# Patient Record
Sex: Male | Born: 1995 | Race: White | Hispanic: No | Marital: Single | Smoking: Former smoker
Health system: Southern US, Community
[De-identification: ages and names within clinical notes are randomized; demographics above are authoritative.]

## PROBLEM LIST (undated history)

## (undated) HISTORY — PX: CIRCUMCISION: SUR203

---
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
## 2015-02-07 ENCOUNTER — Ambulatory Visit (INDEPENDENT_AMBULATORY_CARE_PROVIDER_SITE_OTHER): Payer: BLUE CROSS/BLUE SHIELD

## 2015-02-07 ENCOUNTER — Ambulatory Visit (INDEPENDENT_AMBULATORY_CARE_PROVIDER_SITE_OTHER): Payer: BLUE CROSS/BLUE SHIELD | Admitting: Physician Assistant

## 2015-02-07 VITALS — BP 112/70 | HR 109 | Temp 97.8°F | Resp 20 | Ht 67.5 in | Wt 138.4 lb

## 2015-02-07 DIAGNOSIS — M545 Low back pain, unspecified: Secondary | ICD-10-CM

## 2015-02-07 DIAGNOSIS — M62830 Muscle spasm of back: Secondary | ICD-10-CM | POA: Diagnosis not present

## 2015-02-07 DIAGNOSIS — M546 Pain in thoracic spine: Secondary | ICD-10-CM | POA: Diagnosis not present

## 2015-02-07 MED ORDER — CYCLOBENZAPRINE HCL 5 MG PO TABS: 5.0000 mg | ORAL_TABLET | Freq: Three times a day (TID) | ORAL | Status: DC | PRN

## 2015-02-07 NOTE — Progress Notes (Signed)
   Subjective:    Patient ID: Steven Dyer, male    DOB: 09/28/1996, 19 y.o.   MRN: [id number]  Chief Complaint  Patient presents with  . Back Pain    lower back pain, fell down steps last night    Prior to Admission medications   Not on File   Medications, allergies, past medical history, surgical history, family history, social history and problem list reviewed and updated.  HPI  [age] yom with no significant pmh presents with back pain after fall.   Was carrying towels up steps yest evening and tripped over rug at top. Fell backwards down 18 steps. Does not think he hit his head. Low back pain since the fall. Kept him up last night. Denies HA, vision changes, confusion, loc, head or neck pain. Denies bowel/bladder dysfx. Denies perianal los. Denies numbness/weakness/tingling down any extremity. Denies hematuria.   Mildly elevated HR today. Pt states his low back is very painful.   Review of Systems See HPI.     Objective:   Physical Exam  Constitutional: He is oriented to person, place, and time. He appears well-developed and well-nourished.  Non-toxic appearance. He does not have a sickly appearance. He does not appear ill. No distress.  BP 112/70 mmHg  Pulse 109  Temp(Src) 97.8 F (36.6 C) (Oral)  Resp 20  Ht 5' 7.5" (1.715 m)  Wt 138 lb 6.4 oz (62.778 kg)  BMI 21.34 kg/m2  SpO2 98%   Eyes: Conjunctivae and EOM are normal. Pupils are equal, round, and reactive to light.  Abdominal: There is no CVA tenderness.  Musculoskeletal:       Cervical back: He exhibits normal range of motion, no tenderness and no bony tenderness.       Thoracic back: He exhibits bony tenderness. He exhibits normal range of motion.       Lumbar back: He exhibits decreased range of motion, tenderness, bony tenderness and spasm.  TTP over thoracic and lumbar spine. TTP lumbar paraspinals. Spasms bilaterally over lumbar region. Negative SLR bilaterally. Decreased low back flexion due to pain.     Neurological: He is alert and oriented to person, place, and time. He has normal strength. No cranial nerve deficit or sensory deficit. He displays a negative Romberg sign.  Reflex Scores:      Patellar reflexes are 2+ on the right side and 2+ on the left side.  [facility] reading (PRIMARY) by  Dr. Alwyn RenHopper. Thoracic Findings: Normal Lumbar Findings: Normal     Assessment & Plan:   [age] yom with no significant pmh presents with back pain after fall.   Midline thoracic back pain - Plan: DG Thoracic Spine 2 View Midline low back pain without sciatica - Plan: DG Lumbar Spine 2-3 Views --xrays normal --spasm lumbar paraspinals --> heat, massage, flexeril prn, light rom, cycle tylenol/nsaids --rtc if no improvement 4-5 days or with numbness/weakness/tingling  Donnajean Lopesodd M. Kitiara Hintze, PA-C Physician Assistant-Certified [facility]  02/07/2015 2:13 PM

## 2015-02-07 NOTE — Patient Instructions (Signed)
Your spinal xrays looked normal.  You have spasm over the muscles along your lower spine. Heat, light massage, light range of motion, and flexeril every 8 hours as needed will help with the pain/spasm. Please come back to be seen if you're not feeling better in 4-5 days or if you start noticing any numbness or weakness.   Muscle Cramps and Spasms Muscle cramps and spasms occur when a muscle or muscles tighten and you have no control over this tightening (involuntary muscle contraction). They are a common problem and can develop in any muscle. The most common place is in the calf muscles of the leg. Both muscle cramps and muscle spasms are involuntary muscle contractions, but they also have differences:   Muscle cramps are sporadic and painful. They may last a few seconds to a quarter of an hour. Muscle cramps are often more forceful and last longer than muscle spasms.  Muscle spasms may or may not be painful. They may also last just a few seconds or much longer. CAUSES  It is uncommon for cramps or spasms to be due to a serious underlying problem. In many cases, the cause of cramps or spasms is unknown. Some common causes are:   Overexertion.   Overuse from repetitive motions (doing the same thing over and over).   Remaining in a certain position for a long period of time.   Improper preparation, form, or technique while performing a sport or activity.   Dehydration.   Injury.   Side effects of some medicines.   Abnormally low levels of the salts and ions in your blood (electrolytes), especially potassium and calcium. This could happen if you are taking water pills (diuretics) or you are pregnant.  Some underlying medical problems can make it more likely to develop cramps or spasms. These include, but are not limited to:   Diabetes.   Parkinson disease.   Hormone disorders, such as thyroid problems.   Alcohol abuse.   Diseases specific to muscles, joints, and bones.    Blood vessel disease where not enough blood is getting to the muscles.  HOME CARE INSTRUCTIONS   Stay well hydrated. Drink enough water and fluids to keep your urine clear or pale yellow.  It may be helpful to massage, stretch, and relax the affected muscle.  For tight or tense muscles, use a warm towel, heating pad, or hot shower water directed to the affected area.  If you are sore or have pain after a cramp or spasm, applying ice to the affected area may relieve discomfort.  Put ice in a plastic bag.  Place a towel between your skin and the bag.  Leave the ice on for 15-20 minutes, 03-04 times a day.  Medicines used to treat a known cause of cramps or spasms may help reduce their frequency or severity. Only take over-the-counter or prescription medicines as directed by your caregiver. SEEK MEDICAL CARE IF:  Your cramps or spasms get more severe, more frequent, or do not improve over time.  MAKE SURE YOU:   Understand these instructions.  Will watch your condition.  Will get help right away if you are not doing well or get worse. Document Released: 03/23/2002 Document Revised: 01/26/2013 Document Reviewed: 09/17/2012 [facility] Patient Information 2015 [employer] [place]. This information is not intended to replace advice given to you by your health care provider. Make sure you discuss any questions you have with your health care provider.

## 2015-02-15 ENCOUNTER — Encounter (HOSPITAL_COMMUNITY): Payer: Self-pay

## 2015-02-15 ENCOUNTER — Emergency Department (HOSPITAL_COMMUNITY)
Admission: EM | Admit: 2015-02-15 | Discharge: 2015-02-15 | Disposition: A | Discharge status: Home / Self Care | Payer: BLUE CROSS/BLUE SHIELD | Attending: Emergency Medicine | Admitting: Emergency Medicine

## 2015-02-15 ENCOUNTER — Emergency Department (HOSPITAL_COMMUNITY): Payer: BLUE CROSS/BLUE SHIELD

## 2015-02-15 DIAGNOSIS — Y998 Other external cause status: Secondary | ICD-10-CM | POA: Diagnosis not present

## 2015-02-15 DIAGNOSIS — S300XXA Contusion of lower back and pelvis, initial encounter: Secondary | ICD-10-CM

## 2015-02-15 DIAGNOSIS — R55 Syncope and collapse: Secondary | ICD-10-CM | POA: Diagnosis present

## 2015-02-15 DIAGNOSIS — S20229A Contusion of unspecified back wall of thorax, initial encounter: Secondary | ICD-10-CM | POA: Diagnosis not present

## 2015-02-15 DIAGNOSIS — Y9389 Activity, other specified: Secondary | ICD-10-CM | POA: Insufficient documentation

## 2015-02-15 DIAGNOSIS — R569 Unspecified convulsions: Secondary | ICD-10-CM | POA: Insufficient documentation

## 2015-02-15 DIAGNOSIS — Y92481 Parking lot as the place of occurrence of the external cause: Secondary | ICD-10-CM | POA: Insufficient documentation

## 2015-02-15 LAB — CBG MONITORING, ED: GLUCOSE-CAPILLARY: 119 mg/dL — AB (ref 70–99)

## 2015-02-15 MED ORDER — HYDROCODONE-ACETAMINOPHEN 5-325 MG PO TABS: 1.0000 | ORAL_TABLET | ORAL | Status: DC | PRN

## 2015-02-15 NOTE — ED Notes (Addendum)
Pt refusing lab work, IV, or finger stick. Pt reports he is scared of needles and says "I'm good." Dr. Blinda LeatherwoodPollina is aware of this and agreeable that pt can refuse at this time.

## 2015-02-15 NOTE — ED Notes (Signed)
Bed: WA17 Expected date:  Expected time:  Means of arrival:  Comments: seizure 

## 2015-02-15 NOTE — Discharge Instructions (Signed)
Syncope °Syncope is a medical term for fainting or passing out. This means you lose consciousness and drop to the ground. People are generally unconscious for less than 5 minutes. You may have some muscle twitches for up to 15 seconds before waking up and returning to normal. Syncope occurs more often in older adults, but it can happen to anyone. While most causes of syncope are not dangerous, syncope can be a sign of a serious medical problem. It is important to seek medical care.  °CAUSES  °Syncope is caused by a sudden drop in blood flow to the brain. The specific cause is often not determined. Factors that can bring on syncope include: °· Taking medicines that lower blood pressure. °· Sudden changes in posture, such as standing up quickly. °· Taking more medicine than prescribed. °· Standing in one place for too long. °· Seizure disorders. °· Dehydration and excessive exposure to heat. °· Low blood sugar (hypoglycemia). °· Straining to have a bowel movement. °· Heart disease, irregular heartbeat, or other circulatory problems. °· Fear, emotional distress, seeing blood, or severe pain. °SYMPTOMS  °Right before fainting, you may: °· Feel dizzy or light-headed. °· Feel nauseous. °· See all white or all black in your field of vision. °· Have cold, clammy skin. °DIAGNOSIS  °Your health care provider will ask about your symptoms, perform a physical exam, and perform an electrocardiogram (ECG) to record the electrical activity of your heart. Your health care provider may also perform other heart or blood tests to determine the cause of your syncope which may include: °· Transthoracic echocardiogram (TTE). During echocardiography, sound waves are used to evaluate how blood flows through your heart. °· Transesophageal echocardiogram (TEE). °· Cardiac monitoring. This allows your health care provider to monitor your heart rate and rhythm in real time. °· Holter monitor. This is a portable device that records your  heartbeat and can help diagnose heart arrhythmias. It allows your health care provider to track your heart activity for several days, if needed. °· Stress tests by exercise or by giving medicine that makes the heart beat faster. °TREATMENT  °In most cases, no treatment is needed. Depending on the cause of your syncope, your health care provider may recommend changing or stopping some of your medicines. °HOME CARE INSTRUCTIONS °· Have someone stay with you until you feel stable. °· Do not drive, use machinery, or play sports until your health care provider says it is okay. °· Keep all follow-up appointments as directed by your health care provider. °· Lie down right away if you start feeling like you might faint. Breathe deeply and steadily. Wait until all the symptoms have passed. °· Drink enough fluids to keep your urine clear or pale yellow. °· If you are taking blood pressure or heart medicine, get up slowly and take several minutes to sit and then stand. This can reduce dizziness. °SEEK IMMEDIATE MEDICAL CARE IF:  °· You have a severe headache. °· You have unusual pain in the chest, abdomen, or back. °· You are bleeding from your mouth or rectum, or you have black or tarry stool. °· You have an irregular or very fast heartbeat. °· You have pain with breathing. °· You have repeated fainting or seizure-like jerking during an episode. °· You faint when sitting or lying down. °· You have confusion. °· You have trouble walking. °· You have severe weakness. °· You have vision problems. °If you fainted, call your local emergency services (911 in [place] Do not drive   yourself to the hospital.  MAKE SURE YOU:  Understand these instructions.  Will watch your condition.  Will get help right away if you are not doing well or get worse. Document Released: 10/01/2005 Document Revised: 10/06/2013 Document Reviewed: 11/30/2011 [facility] Patient Information 2015 [place], [place]. This information is not intended to replace  advice given to you by your health care provider. Make sure you discuss any questions you have with your health care provider. Driving and Equipment Restrictions Some medical problems make it dangerous to drive, ride a bike, or use machines. Some of these problems are:  A hard blow to the head (concussion).  Passing out (fainting).  Twitching and shaking (seizures).  Low blood sugar.  Taking medicine to help you relax (sedatives).  Taking pain medicines.  Wearing an eye patch.  Wearing splints. This can make it hard to use parts of your body that you need to drive safely. HOME CARE   Do not drive until your doctor says it is okay.  Do not use machines until your doctor says it is okay. You may need a form signed by your doctor (medical release) before you can drive again. You may also need this form before you do other tasks where you need to be fully alert. MAKE SURE YOU:  Understand these instructions.  Will watch your condition.  Will get help right away if you are not doing well or get worse. Document Released: 11/08/2004 Document Revised: 12/24/2011 Document Reviewed: 02/08/2010 [facility] Patient Information 2015 [place], [place]. This information is not intended to replace advice given to you by your health care provider. Make sure you discuss any questions you have with your health care provider.

## 2015-02-15 NOTE — ED Notes (Signed)
Patient refused to get his blood collect.  I made nurse aware.
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
## 2015-02-15 NOTE — ED Notes (Signed)
Patient transported to X-ray 
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
## 2015-03-03 ENCOUNTER — Other Ambulatory Visit: Payer: Self-pay | Admitting: Family

## 2015-03-03 ENCOUNTER — Encounter: Payer: Self-pay | Admitting: *Deleted

## 2015-03-03 DIAGNOSIS — R404 Transient alteration of awareness: Secondary | ICD-10-CM

## 2015-03-09 ENCOUNTER — Ambulatory Visit (INDEPENDENT_AMBULATORY_CARE_PROVIDER_SITE_OTHER): Payer: BLUE CROSS/BLUE SHIELD | Admitting: Family Medicine

## 2015-03-09 ENCOUNTER — Ambulatory Visit (INDEPENDENT_AMBULATORY_CARE_PROVIDER_SITE_OTHER): Payer: BLUE CROSS/BLUE SHIELD

## 2015-03-09 VITALS — BP 104/78 | HR 97 | Temp 98.1°F | Resp 16 | Ht 69.0 in | Wt 139.0 lb

## 2015-03-09 DIAGNOSIS — S60221A Contusion of right hand, initial encounter: Secondary | ICD-10-CM | POA: Diagnosis not present

## 2015-03-09 DIAGNOSIS — M79641 Pain in right hand: Secondary | ICD-10-CM

## 2015-03-09 MED ORDER — HYDROCODONE-ACETAMINOPHEN 5-325 MG PO TABS: 1.0000 | ORAL_TABLET | ORAL | Status: DC | PRN

## 2015-03-09 NOTE — Progress Notes (Signed)
[facility] [address], [place] [id number] [phone]  Date:  03/09/2015   Name:  Steven Dyer   DOB:  04/25/1996   MRN:  [id number]  PCP:  Carmin RichmondLARK,WILLIAM D, MD    Chief Complaint: Hand Pain   History of Present Illness:  Steven MylarJacob M Bovenzi is a 19 y.o. very pleasant male patient who presents with the following:  He caught his right hand in a wooden door yesterday.  It hit him on the 3rd knuckle.  He has a lot of pain in the area. He was up last night due to the pain, and it seems to be getting worse.  He has used "a lot of ibuprofen" but it has not helped.  His father is at clinic and would like him to have something for pain.   He is right handed- he went to school today but had a hard time doing the work due to his hand problem  He is generally in good health  NKDA There are no active problems to display for this patient.   History reviewed. No pertinent past medical history.  History reviewed. No pertinent past surgical history.  History  Substance Use Topics  . Smoking status: Never Smoker   . Smokeless tobacco: Never Used  . Alcohol Use: No    Family History  Problem Relation Age of Onset  . Migraines Mother   . Nephrolithiasis Father     No Known Allergies  Medication list has been reviewed and updated.  Current Outpatient Prescriptions on File Prior to Visit  Medication Sig Dispense Refill  . cyclobenzaprine (FLEXERIL) 5 MG tablet Take 1 tablet (5 mg total) by mouth 3 (three) times daily as needed for muscle spasms. (Patient not taking: Reported on 03/09/2015) 30 tablet 1  . HYDROcodone-acetaminophen (NORCO/VICODIN) 5-325 MG per tablet Take 1-2 tablets by mouth every 4 (four) hours as needed for moderate pain. (Patient not taking: Reported on 03/09/2015) 15 tablet 0  . ibuprofen (ADVIL,MOTRIN) 200 MG tablet Take 400-600 mg by mouth every 6 (six) hours as needed for headache or moderate pain.     No current facility-administered  medications on file prior to visit.    Review of Systems:  As per HPI- otherwise negative. Otherwise he feels well today  Physical Examination: Filed Vitals:   03/09/15 1416  BP: 104/78  Pulse: 97  Temp: 98.1 F (36.7 C)  Resp: 16   Filed Vitals:   03/09/15 1416  Height: 5\' 9"  (1.753 m)  Weight: 139 lb (63.05 kg)   Body mass index is 20.52 kg/(m^2). Ideal Body Weight: Weight in (lb) to have BMI = 25: 168.9  GEN: WDWN, NAD, Non-toxic, A & O x 3, looks well HEENT: Atraumatic, Normocephalic. Neck supple. No masses, No LAD. Ears and Nose: No external deformity. CV: RRR, No M/G/R. No JVD. No thrill. No extra heart sounds. PULM: CTA B, no wheezes, crackles, rhonchi. No retractions. No resp. distress. No accessory muscle use. EXTR: No c/c/e NEURO Normal gait.  PSYCH: Normally interactive. Conversant. Not depressed or anxious appearing.  Calm demeanor.  Right hand: he is tender and has some swelling over the long finger MCP joint.  There is some tenderness at the other 3 MCP joints (not the thumb) but less so.  Fingers are NV intact.  He has pain with full extension of the MCP or full flexion.  Normal strength of the fingers   [facility] reading (PRIMARY) by  Dr. Patsy Lageropland. Right  hand: negative  RIGHT HAND - COMPLETE 3+ VIEW  COMPARISON: Feb 17, 2011  FINDINGS: Frontal, oblique, and lateral views were obtained. There is no fracture or dislocation. Joint spaces appear intact. No erosive change.  IMPRESSION: No fracture or dislocation. No appreciable arthropathy.  Placed in an ulnar gutter splint for protection  tdap 2014  Assessment and Plan: Contusion of right hand, initial encounter  Pain of right hand - Plan: DG Hand Complete Right, HYDROcodone-acetaminophen (NORCO/VICODIN) 5-325 MG per tablet  Ulnar gutter splint for protection.  At this time no apparent fracture.  However if his sx persist he will follow-up vicodin as needed.  Cautioned that dosage on rx is maximum  dose- would recommend that he start with 1 tab every 6-8 hours as needed.  He and his father understand the plan and how to use the medication  Signed Abbe Amsterdam, MD

## 2015-03-09 NOTE — Progress Notes (Signed)
PROCEDURE NOTE: Splint, short arm Patient's father provided consent. Applied 5"x10" ulnar gutter splint to right forearm. Hand was flexed 90 degrees at MCP joint. Patient tolerated this procedure well.  Wallis BambergMario Garvey Westcott, PA-C [facility] [phone] 03/09/2015  3:48 PM

## 2015-03-09 NOTE — Patient Instructions (Signed)
Wear the splint for the next few days.   Take the pain medication (hydrocodone) as needed but do not drive on this medication You can also use ibuprofen or aleve as needed Ice may also be helpful If your hand is not feeling a lot better in about one week please let me know- Sooner if worse.  Assuming you are improving you may remove the splint after 3-4 days (sooner if you no longer need it for pain)

## 2015-03-10 ENCOUNTER — Encounter: Payer: Self-pay | Admitting: Cardiology

## 2015-03-10 ENCOUNTER — Ambulatory Visit (INDEPENDENT_AMBULATORY_CARE_PROVIDER_SITE_OTHER): Payer: BLUE CROSS/BLUE SHIELD | Admitting: Cardiology

## 2015-03-10 VITALS — BP 111/76 | HR 75 | Ht 67.5 in | Wt 140.0 lb

## 2015-03-10 DIAGNOSIS — R55 Syncope and collapse: Secondary | ICD-10-CM | POA: Insufficient documentation

## 2015-03-10 DIAGNOSIS — R000 Tachycardia, unspecified: Secondary | ICD-10-CM

## 2015-03-10 NOTE — Patient Instructions (Signed)
Your physician recommends that you schedule a follow-up appointment to be determined after test  Your physician has requested that you have an echocardiogram. Echocardiography is a painless test that uses sound waves to create images of your heart. It provides your doctor with information about the size and shape of your heart and how well your heart's chambers and valves are working. This procedure takes approximately one hour. There are no restrictions for this procedure.  Your physician recommends that you continue on your current medications as directed. Please refer to the Current Medication list given to you today.  Thank you for choosing [facility]!!
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
## 2015-03-11 ENCOUNTER — Ambulatory Visit: Payer: BLUE CROSS/BLUE SHIELD | Admitting: Cardiology

## 2015-03-15 ENCOUNTER — Other Ambulatory Visit: Payer: Self-pay

## 2015-03-15 ENCOUNTER — Ambulatory Visit (HOSPITAL_COMMUNITY)
Admission: RE | Admit: 2015-03-15 | Discharge: 2015-03-15 | Disposition: A | Discharge status: Home / Self Care | Payer: BLUE CROSS/BLUE SHIELD | Source: Ambulatory Visit | Attending: Family | Admitting: Family

## 2015-03-15 ENCOUNTER — Ambulatory Visit (HOSPITAL_BASED_OUTPATIENT_CLINIC_OR_DEPARTMENT_OTHER): Payer: BLUE CROSS/BLUE SHIELD

## 2015-03-15 DIAGNOSIS — R55 Syncope and collapse: Secondary | ICD-10-CM | POA: Insufficient documentation

## 2015-03-15 DIAGNOSIS — R404 Transient alteration of awareness: Secondary | ICD-10-CM

## 2015-03-15 NOTE — Progress Notes (Signed)
EEG Completed; Results Pending  
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
## 2015-03-16 ENCOUNTER — Ambulatory Visit (INDEPENDENT_AMBULATORY_CARE_PROVIDER_SITE_OTHER): Payer: BLUE CROSS/BLUE SHIELD | Admitting: Pediatrics

## 2015-03-16 ENCOUNTER — Encounter: Payer: Self-pay | Admitting: Pediatrics

## 2015-03-16 ENCOUNTER — Telehealth: Payer: Self-pay | Admitting: *Deleted

## 2015-03-16 VITALS — BP 108/72 | HR 106 | Ht 68.0 in | Wt 142.2 lb

## 2015-03-16 DIAGNOSIS — R55 Syncope and collapse: Secondary | ICD-10-CM | POA: Insufficient documentation

## 2015-03-16 NOTE — Telephone Encounter (Signed)
Pt father made aware, orders placed for 14 day event monitor. Pt father not happy about additional testing required but agreed to proceed. Will place recall for pt to be followed in [address] office.
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
## 2015-03-24 ENCOUNTER — Telehealth: Payer: Self-pay | Admitting: *Deleted

## 2015-03-24 NOTE — Telephone Encounter (Signed)
Routed monitor report to [facility] st office.

## 2015-03-24 NOTE — Telephone Encounter (Signed)
Decision is up to them, they are free to refuse study. There follow up and any paperwork will need to be through the [facility] clinic  Dominga Ferry MD

## 2015-03-24 NOTE — Telephone Encounter (Signed)
Monitoring service spoke w/pt dad who says pt will not be wearing device because pt has had other testing and he thinks it's going over and beyond. Dad says device is in preparation to be sent back. Dad has decided against the study. Will forward to Dr. Wyline Mood.

## 2015-08-24 ENCOUNTER — Ambulatory Visit (INDEPENDENT_AMBULATORY_CARE_PROVIDER_SITE_OTHER): Payer: BLUE CROSS/BLUE SHIELD | Admitting: Family Medicine

## 2015-08-24 VITALS — BP 124/76 | HR 83 | Temp 98.1°F | Resp 16 | Ht 68.5 in | Wt 150.6 lb

## 2015-08-24 DIAGNOSIS — J209 Acute bronchitis, unspecified: Secondary | ICD-10-CM | POA: Diagnosis not present

## 2015-08-24 MED ORDER — AZITHROMYCIN 250 MG PO TABS: ORAL_TABLET | ORAL | Status: DC

## 2015-08-24 MED ORDER — HYDROCODONE-HOMATROPINE 5-1.5 MG/5ML PO SYRP: 5.0000 mL | ORAL_SOLUTION | Freq: Four times a day (QID) | ORAL | Status: DC | PRN

## 2015-08-24 NOTE — Assessment & Plan Note (Signed)
No concerning signs at this time.  Explained lack of efficacy of ABx but pt would like these anyways. - Mucinex 1200 mg BID at this time.  Nasal Saline - Tussinex PRN for cough, z-pack

## 2015-08-24 NOTE — Progress Notes (Signed)
Steven Dyer is a 19 y.o. male who presents today for ongoing cough/congestion x 2 days.  Cough/Congestion - Ongoing now for about 2 days and has tried Nyquil/Dayquil with no relief.  Denies productive cough and nasal drainage.  No fevers, chills/sweats.  Denies sinus TTP or pain at this time.  + 1/4 to 1/2 PPD for the past 2-3 yrs.  Did not get influenza shot this year but denies any myalgia, chills, nauase, vomiting.   History reviewed. No pertinent past medical history.  History  Smoking status  . Never Smoker   Smokeless tobacco  . Never Used    Family History  Problem Relation Age of Onset  . Migraines Mother   . Heart attack Mother     Died at 1834 due to Cardiac arrest  . Nephrolithiasis Father   . Dementia Maternal Grandmother     Died at 8175    Current Outpatient Prescriptions on File Prior to Visit  Medication Sig Dispense Refill  . HYDROcodone-acetaminophen (NORCO/VICODIN) 5-325 MG per tablet Take 1-2 tablets by mouth every 4 (four) hours as needed for moderate pain. (Patient not taking: Reported on 08/24/2015) 15 tablet 0   No current facility-administered medications on file prior to visit.    ROS: Per HPI.  All other systems reviewed and are negative.   Physical Exam Filed Vitals:   08/24/15 1907  BP: 124/76  Pulse: 83  Temp: 98.1 F (36.7 C)  Resp: 16    Physical Examination: General appearance - alert, well appearing, and in no distress Neck - supple, no significant adenopathy Chest - clear to auscultation, no wheezes, rales or rhonchi, symmetric air entry Heart - normal rate and regular rhythm, no murmurs noted    Chemistry   No results found for: NA, K, CL, CO2, BUN, CREATININE, GLU No results found for: CALCIUM, ALKPHOS, AST, ALT, BILITOT    No results found for: WBC, HGB, HCT, MCV, PLT No results found for: TSH No results found for: HGBA1C

## 2015-11-14 ENCOUNTER — Ambulatory Visit (INDEPENDENT_AMBULATORY_CARE_PROVIDER_SITE_OTHER): Payer: BLUE CROSS/BLUE SHIELD | Admitting: Physician Assistant

## 2015-11-14 VITALS — BP 110/76 | HR 100 | Temp 98.4°F | Resp 16 | Ht 69.0 in | Wt 151.0 lb

## 2015-11-14 DIAGNOSIS — B9789 Other viral agents as the cause of diseases classified elsewhere: Principal | ICD-10-CM

## 2015-11-14 DIAGNOSIS — J069 Acute upper respiratory infection, unspecified: Secondary | ICD-10-CM | POA: Diagnosis not present

## 2015-11-14 MED ORDER — HYDROCOD POLST-CPM POLST ER 10-8 MG/5ML PO SUER: 5.0000 mL | Freq: Two times a day (BID) | ORAL | Status: DC | PRN

## 2015-11-14 NOTE — Patient Instructions (Signed)
Drink plenty of water (64 oz/day) and get plenty of rest. If you have been prescribed a cough syrup, do not drive or operate heavy machinery while using this medication. Continue mucinex during the day. If your symptoms are not improving in 10-14 days, return to clinic.

## 2015-11-14 NOTE — Progress Notes (Signed)
[facility] [address], [place] [address] [phone]  Date:  11/14/2015   Name:  Steven Dyer   DOB:  1996-01-02   MRN:  [id number]  PCP:  Carmin Richmond, MD    Chief Complaint: Cough; Nasal Congestion; and chest congestion   History of Present Illness:  This is a 20 y.o. male who is presenting with cough and nasal congestion x 2-3 days.  Cough: dry during the day, more productive at night. Having a hard time sleeping at night. SOB/wheezing: no Nasal congestion: yes Otalgia: no Sore throat: no Fever/chills: no Aggravating/alleviating factors: taking delsym, mucinex - not helping History of asthma: no History of env allergies: no Tobacco use: no No sick contacts.   Review of Systems:  Review of Systems See HPI  Patient Active Problem List   Diagnosis Date Noted  . Acute bronchitis 08/24/2015  . Vasovagal syncope 03/16/2015    Prior to Admission medications   Not on File    No Known Allergies  Past Surgical History  Procedure Laterality Date  . Circumcision  1997    Social History  Substance Use Topics  . Smoking status: Never Smoker   . Smokeless tobacco: Never Used  . Alcohol Use: No    Family History  Problem Relation Age of Onset  . Migraines Mother   . Heart attack Mother     Died at 73 due to Cardiac arrest  . Nephrolithiasis Father   . Dementia Maternal Grandmother     Died at 74    Medication list has been reviewed and updated.  Physical Examination:  Physical Exam  Constitutional: He is oriented to person, place, and time. He appears well-developed and well-nourished. No distress.  HENT:  Head: Normocephalic and atraumatic.  Right Ear: Hearing, tympanic membrane, external ear and ear canal normal.  Left Ear: Hearing, tympanic membrane, external ear and ear canal normal.  Nose: Rhinorrhea present.  Mouth/Throat: Uvula is midline, oropharynx is clear and moist and mucous membranes are normal.  Eyes:  Conjunctivae and lids are normal. Right eye exhibits no discharge. Left eye exhibits no discharge. No scleral icterus.  Cardiovascular: Normal rate, regular rhythm, normal heart sounds and normal pulses.   No murmur heard. Pulmonary/Chest: Effort normal and breath sounds normal. No respiratory distress. He has no wheezes. He has no rhonchi. He has no rales.  Musculoskeletal: Normal range of motion.  Lymphadenopathy:       Head (right side): No submental, no submandibular and no tonsillar adenopathy present.       Head (left side): No submental, no submandibular and no tonsillar adenopathy present.    He has no cervical adenopathy.  Neurological: He is alert and oriented to person, place, and time.  Skin: Skin is warm, dry and intact. No lesion and no rash noted.  Psychiatric: He has a normal mood and affect. His speech is normal and behavior is normal. Thought content normal.    BP 110/76 mmHg  Pulse 100  Temp(Src) 98.4 F (36.9 C) (Oral)  Resp 16  Ht  (1.753 m)  Wt 151 lb (68.493 kg)  BMI 22.29 kg/m2  SpO2 98%  Assessment and Plan:  1. Viral URI with cough Likely viral etiology. Focus is on supportive care - mucinex during the day and tussionex at night for sleep. Return if symptoms not improving in 10-14 days. - chlorpheniramine-HYDROcodone (TUSSIONEX PENNKINETIC ER) 10-8 MG/5ML SUER; Take 5 mLs by mouth every 12 (twelve) hours as  needed for cough.  Dispense: 100 mL; Refill: 0   Roswell Miners. Dyke Brackett, MHS [facility]  11/14/2015

## 2016-01-12 ENCOUNTER — Ambulatory Visit (INDEPENDENT_AMBULATORY_CARE_PROVIDER_SITE_OTHER): Payer: BLUE CROSS/BLUE SHIELD | Admitting: Physician Assistant

## 2016-01-12 VITALS — BP 126/62 | HR 98 | Temp 97.8°F | Resp 16 | Ht 69.0 in | Wt 149.8 lb

## 2016-01-12 DIAGNOSIS — J069 Acute upper respiratory infection, unspecified: Secondary | ICD-10-CM

## 2016-01-12 DIAGNOSIS — B9789 Other viral agents as the cause of diseases classified elsewhere: Principal | ICD-10-CM

## 2016-01-12 MED ORDER — HYDROCOD POLST-CPM POLST ER 10-8 MG/5ML PO SUER: 5.0000 mL | Freq: Every evening | ORAL | Status: DC | PRN

## 2016-01-12 NOTE — Progress Notes (Signed)
[facility] [address], [place] [address] [phone]  Date:  01/12/2016   Name:  Steven Dyer   DOB:  07-04-1996   MRN:  [id number]  PCP:  Carmin Richmond, MD   Chief Complaint  Patient presents with  . Sinusitis    green mucus/ x 2 days  . Cough    chest congestion/ x 2 days, pt having trouble sleeping   History of Present Illness:  Steven Dyer is a 20 y.o. male patient [facility]   Sinus congestion has been for 2 days with a green mucus.  This is dominant seen in the mornings.  He has no sinus pressure or fever.  He has also developed cough, that is worsened at night.  It is not productive, but does keep him up at night.  He has no sob or dyspnea.  No fever.  No otalgia, and very mild sore throat.  No GI complaints of abdominal pain.   Patient Active Problem List   Diagnosis Date Noted  . Acute bronchitis 08/24/2015  . Vasovagal syncope 03/16/2015    History reviewed. No pertinent past medical history.  Past Surgical History  Procedure Laterality Date  . Circumcision  1997    Social History  Substance Use Topics  . Smoking status: Never Smoker   . Smokeless tobacco: Never Used  . Alcohol Use: No    Family History  Problem Relation Age of Onset  . Migraines Mother   . Heart attack Mother     Died at 2 due to Cardiac arrest  . Nephrolithiasis Father   . Dementia Maternal Grandmother     Died at 28    No Known Allergies  Medication list has been reviewed and updated.  Current Outpatient Prescriptions on File Prior to Visit  Medication Sig Dispense Refill  . chlorpheniramine-HYDROcodone (TUSSIONEX PENNKINETIC ER) 10-8 MG/5ML SUER Take 5 mLs by mouth every 12 (twelve) hours as needed for cough. (Patient not taking: Reported on 01/12/2016) 100 mL 0   No current facility-administered medications on file prior to visit.    ROS ROS otherwise unremarkable unless listed above.  Physical Examination: BP  126/62 mmHg  Pulse 98  Temp(Src) 97.8 F (36.6 C) (Oral)  Resp 16  Ht  (1.753 m)  Wt 149 lb 12.8 oz (67.949 kg)  BMI 22.11 kg/m2  SpO2 99% Ideal Body Weight: Weight in (lb) to have BMI = 25: 168.9  Physical Exam  Constitutional: He is oriented to person, place, and time. He appears well-developed and well-nourished. No distress.  HENT:  Head: Normocephalic and atraumatic.  Right Ear: Tympanic membrane, external ear and ear canal normal.  Left Ear: Tympanic membrane, external ear and ear canal normal.  Nose: Mucosal edema and rhinorrhea present. Right sinus exhibits no maxillary sinus tenderness and no frontal sinus tenderness. Left sinus exhibits no maxillary sinus tenderness and no frontal sinus tenderness.  Mouth/Throat: No uvula swelling. No oropharyngeal exudate, posterior oropharyngeal edema or posterior oropharyngeal erythema.  Eyes: Conjunctivae, EOM and lids are normal. Pupils are equal, round, and reactive to light. Right eye exhibits normal extraocular motion. Left eye exhibits normal extraocular motion.  Neck: Trachea normal and full passive range of motion without pain. No edema and no erythema present.  Cardiovascular: Normal rate.   Pulmonary/Chest: Effort normal. No respiratory distress. He has no decreased breath sounds. He has no wheezes. He has no rhonchi.  Neurological: He is  alert and oriented to person, place, and time.  Skin: Skin is warm and dry. He is not diaphoretic.  Psychiatric: He has a normal mood and affect. His behavior is normal.     Assessment and Plan: Porfirio MylarJacob M Neuenfeldt is a 20 y.o. male who is here today for cough, congestion. --this appears to be viral  --I have advised supportive treatment.  He has asked for a supportive cough medicine at night, and we will do this at this time (for 10 days).    Viral URI with cough - Plan: chlorpheniramine-HYDROcodone (TUSSIONEX PENNKINETIC ER) 10-8 MG/5ML SUER  Trena PlattStephanie Makya Yurko, PA-C [facility] 01/12/2016 10:14 AM

## 2016-01-12 NOTE — Patient Instructions (Addendum)
IF you received an x-ray today, you will receive an invoice from [facility]. Please contact [facility] at [phone] with questions or concerns regarding your invoice.   IF you received labwork today, you will receive an invoice from [employer]. Please contact [facility] at [phone] with questions or concerns regarding your invoice.   Our billing staff will not be able to assist you with questions regarding bills from these companies.  You will be contacted with the lab results as soon as they are available. The fastest way to get your results is to activate your My Chart account. Instructions are located on the last page of this paperwork. If you have not heard from [place] regarding the results in 2 weeks, please contact this office.    Please continue to hydrate with water 64 oz per day.   Please get as much rest as possible. Take medication as prescribed.  Continue the mucinex and delsym--for daytime cough.   Upper Respiratory Infection, Adult Most upper respiratory infections (URIs) are a viral infection of the air passages leading to the lungs. A URI affects the nose, throat, and upper air passages. The most common type of URI is nasopharyngitis and is typically referred to as "the common cold." URIs run their course and usually go away on their own. Most of the time, a URI does not require medical attention, but sometimes a bacterial infection in the upper airways can follow a viral infection. This is called a secondary infection. Sinus and middle ear infections are common types of secondary upper respiratory infections. Bacterial pneumonia can also complicate a URI. A URI can worsen asthma and chronic obstructive pulmonary disease (COPD). Sometimes, these complications can require emergency medical care and may be life threatening.  CAUSES Almost all URIs are caused by viruses. A virus is a type of germ and can spread from one person to  another.  RISKS FACTORS You may be at risk for a URI if:   You smoke.   You have chronic heart or lung disease.  You have a weakened defense (immune) system.   You are very young or very old.   You have nasal allergies or asthma.  You work in crowded or poorly ventilated areas.  You work in health care facilities or schools. SIGNS AND SYMPTOMS  Symptoms typically develop 2-3 days after you come in contact with a cold virus. Most viral URIs last 7-10 days. However, viral URIs from the influenza virus (flu virus) can last 14-18 days and are typically more severe. Symptoms may include:   Runny or stuffy (congested) nose.   Sneezing.   Cough.   Sore throat.   Headache.   Fatigue.   Fever.   Loss of appetite.   Pain in your forehead, behind your eyes, and over your cheekbones (sinus pain).  Muscle aches.  DIAGNOSIS  Your health care provider may diagnose a URI by:  Physical exam.  Tests to check that your symptoms are not due to another condition such as:  Strep throat.  Sinusitis.  Pneumonia.  Asthma. TREATMENT  A URI goes away on its own with time. It cannot be cured with medicines, but medicines may be prescribed or recommended to relieve symptoms. Medicines may help:  Reduce your fever.  Reduce your cough.  Relieve nasal congestion. HOME CARE INSTRUCTIONS   Take medicines only as directed by your health care provider.   Gargle warm saltwater or take cough drops to comfort your throat as directed by  your health care provider.  Use a warm mist humidifier or inhale steam from a shower to increase air moisture. This may make it easier to breathe.  Drink enough fluid to keep your urine clear or pale yellow.   Eat soups and other clear broths and maintain good nutrition.   Rest as needed.   Return to work when your temperature has returned to normal or as your health care provider advises. You may need to stay home longer to avoid  infecting others. You can also use a face mask and careful hand washing to prevent spread of the virus.  Increase the usage of your inhaler if you have asthma.   Do not use any tobacco products, including cigarettes, chewing tobacco, or electronic cigarettes. If you need help quitting, ask your health care provider. PREVENTION  The best way to protect yourself from getting a cold is to practice good hygiene.   Avoid oral or hand contact with people with cold symptoms.   Wash your hands often if contact occurs.  There is no clear evidence that vitamin C, vitamin E, echinacea, or exercise reduces the chance of developing a cold. However, it is always recommended to get plenty of rest, exercise, and practice good nutrition.  SEEK MEDICAL CARE IF:   You are getting worse rather than better.   Your symptoms are not controlled by medicine.   You have chills.  You have worsening shortness of breath.  You have brown or red mucus.  You have yellow or brown nasal discharge.  You have pain in your face, especially when you bend forward.  You have a fever.  You have swollen neck glands.  You have pain while swallowing.  You have white areas in the back of your throat. SEEK IMMEDIATE MEDICAL CARE IF:   You have severe or persistent:  Headache.  Ear pain.  Sinus pain.  Chest pain.  You have chronic lung disease and any of the following:  Wheezing.  Prolonged cough.  Coughing up blood.  A change in your usual mucus.  You have a stiff neck.  You have changes in your:  Vision.  Hearing.  Thinking.  Mood. MAKE SURE YOU:   Understand these instructions.  Will watch your condition.  Will get help right away if you are not doing well or get worse.   This information is not intended to replace advice given to you by your health care provider. Make sure you discuss any questions you have with your health care provider.   Document Released: 03/27/2001  Document Revised: 02/15/2015 Document Reviewed: 01/06/2014 Elsevier Interactive Patient Education [employer]2016 Elsevier Inc.

## 2016-01-23 ENCOUNTER — Ambulatory Visit (INDEPENDENT_AMBULATORY_CARE_PROVIDER_SITE_OTHER): Payer: BLUE CROSS/BLUE SHIELD | Admitting: Family Medicine

## 2016-01-23 VITALS — BP 122/86 | HR 65 | Temp 97.5°F | Resp 16 | Ht 69.0 in | Wt 148.0 lb

## 2016-01-23 DIAGNOSIS — R058 Other specified cough: Secondary | ICD-10-CM

## 2016-01-23 DIAGNOSIS — J329 Chronic sinusitis, unspecified: Secondary | ICD-10-CM | POA: Diagnosis not present

## 2016-01-23 DIAGNOSIS — R062 Wheezing: Secondary | ICD-10-CM | POA: Diagnosis not present

## 2016-01-23 DIAGNOSIS — R05 Cough: Secondary | ICD-10-CM | POA: Diagnosis not present

## 2016-01-23 MED ORDER — BENZONATATE 100 MG PO CAPS: 100.0000 mg | ORAL_CAPSULE | Freq: Three times a day (TID) | ORAL | Status: DC | PRN

## 2016-01-23 MED ORDER — HYDROCODONE-HOMATROPINE 5-1.5 MG/5ML PO SYRP: 5.0000 mL | ORAL_SOLUTION | ORAL | Status: DC | PRN

## 2016-01-23 MED ORDER — AZITHROMYCIN 250 MG PO TABS: ORAL_TABLET | ORAL | Status: DC

## 2016-01-23 MED ORDER — PREDNISONE 20 MG PO TABS: 20.0000 mg | ORAL_TABLET | Freq: Every day | ORAL | Status: DC

## 2016-01-23 NOTE — Patient Instructions (Addendum)
Drink plenty of fluids and get enough rest  Take the prednisone 3 pills daily for 2 days, 2 daily for 2 days, 1 daily for 2 days. Best taken in the morning. This is for inflammation in the lungs and sinuses and will help get rid of wheezing  Take the Hycodan cough syrup 1 teaspoon every 4-6 hours if needed for cough. This will make you drowsy, as best taken at nighttime.  For daytime cough and wheezing classes you can take the benzonatate cough pills one or 2 pills 3 times daily. This is nonsedating.  Take azithromycin 2 pills initially, then 1 daily for 4 days infection  Return if not improving  Most importantly, urged to quit smoking as we discussed.  IF you received an x-ray today, you will receive an invoice from [facility]. Please contact [facility] at [phone] with questions or concerns regarding your invoice.   IF you received labwork today, you will receive an invoice from [facility]. Please contact [facility] at [phone] with questions or concerns regarding your invoice.   Our billing staff will not be able to assist you with questions regarding bills from these companies.  You will be contacted with the lab results as soon as they are available. The fastest way to get your results is to activate your My Chart account. Instructions are located on the last page of this paperwork. If you have not heard from us regarding the results in 2 weeks, please contact this office.

## 2016-01-23 NOTE — Progress Notes (Signed)
Patient ID: Steven Dyer, male    DOB: 12/25/1995  Age: 20 y.o. MRN: [id number]  Chief Complaint  Patient presents with  . Sinusitis    x 1wk  . Cough    chest congestion/ green mucus/ x1wk    Subjective:   20 year old male who is had a cough. He was here a couple weeks ago, treated with some cough syrup. He did better for a few days then it all came back with a vengeance. He has had head congestion from his sinuses. He continues to cough, not a lot, but some mucus. He is a cigarette smoker. He has been wheezing. He coughs enough that it disturbs his sleep.  He is a Consulting civil engineer at [employer].  Current allergies, medications, problem list, past/family and social histories reviewed.  Objective:  BP 122/86 mmHg  Pulse 65  Temp(Src) 97.5 F (36.4 C) (Oral)  Resp 16  Ht  (1.753 m)  Wt 148 lb (67.132 kg)  BMI 21.85 kg/m2  SpO2 98%  Pleasant young man, alert and oriented. Sniffling from his head congestion and intermittently coughing spasmodically. His TMs are normal. Throat clear. Neck supple without significant nodes. Chest is clear to auscultation until he does forced expiration which time he has some wheezing.  Assessment & Plan:   Assessment: 1. Post-viral cough syndrome   2. Wheeze   3. Rhinosinusitis       Plan: This a post viral cough type infection. Will treat as per instructions.  No orders of the defined types were placed in this encounter.    Meds ordered this encounter  Medications  . predniSONE (DELTASONE) 20 MG tablet    Sig: Take 1 tablet (20 mg total) by mouth daily with breakfast.    Dispense:  12 tablet    Refill:  0  . benzonatate (TESSALON) 100 MG capsule    Sig: Take 1-2 capsules (100-200 mg total) by mouth 3 (three) times daily as needed for cough.    Dispense:  40 capsule    Refill:  0  . HYDROcodone-homatropine (HYCODAN) 5-1.5 MG/5ML syrup    Sig: Take 5 mLs by mouth every 4 (four) hours as needed.    Dispense:  120 mL    Refill:  0  . azithromycin (ZITHROMAX) 250 MG tablet    Sig: Take 2 tabs PO x 1 dose, then 1 tab PO QD x 4 days    Dispense:  6 tablet    Refill:  0         Patient Instructions   Drink plenty of fluids and get enough rest  Take the prednisone 3 pills daily for 2 days, 2 daily for 2 days, 1 daily for 2 days. Best taken in the morning. This is for inflammation in the lungs and sinuses and will help get rid of wheezing  Take the Hycodan cough syrup 1 teaspoon every 4-6 hours if needed for cough. This will make you drowsy, as best taken at nighttime.  For daytime cough and wheezing classes you can take the benzonatate cough pills one or 2 pills 3 times daily. This is nonsedating.  Take azithromycin 2 pills initially, then 1 daily for 4 days infection  Return if not improving  Most importantly, urged to quit smoking as we discussed.  IF you received an x-ray today, you will receive an invoice from [facility]. Please contact [facility] at [phone] with questions or concerns regarding your invoice.   IF you received labwork  today, you will receive an invoice from [facility]. Please contact [facility] at [phone] with questions or concerns regarding your invoice.   Our billing staff will not be able to assist you with questions regarding bills from these companies.  You will be contacted with the lab results as soon as they are available. The fastest way to get your results is to activate your My Chart account. Instructions are located on the last page of this paperwork. If you have not heard from us regarding the results in 2 weeks, please contact this office.          Return if symptoms worsen or fail to improve.   Kalley Nicholl, MD 01/23/2016

## 2016-02-14 ENCOUNTER — Ambulatory Visit (INDEPENDENT_AMBULATORY_CARE_PROVIDER_SITE_OTHER): Payer: BLUE CROSS/BLUE SHIELD

## 2016-02-14 ENCOUNTER — Ambulatory Visit (INDEPENDENT_AMBULATORY_CARE_PROVIDER_SITE_OTHER): Payer: BLUE CROSS/BLUE SHIELD | Admitting: Physician Assistant

## 2016-02-14 VITALS — BP 102/68 | HR 91 | Temp 98.5°F | Resp 16 | Ht 70.0 in | Wt 153.0 lb

## 2016-02-14 DIAGNOSIS — M25531 Pain in right wrist: Secondary | ICD-10-CM

## 2016-02-14 DIAGNOSIS — M79641 Pain in right hand: Secondary | ICD-10-CM

## 2016-02-14 DIAGNOSIS — S60221A Contusion of right hand, initial encounter: Secondary | ICD-10-CM

## 2016-02-14 NOTE — Patient Instructions (Addendum)
IF you received an x-ray today, you will receive an invoice from [facility]. Please contact [facility] at [phone] with questions or concerns regarding your invoice.   IF you received labwork today, you will receive an invoice from [employer]. Please contact [facility] at [phone] with questions or concerns regarding your invoice.   Our billing staff will not be able to assist you with questions regarding bills from these companies.  You will be contacted with the lab results as soon as they are available. The fastest way to get your results is to activate your My Chart account. Instructions are located on the last page of this paperwork. If you have not heard from [place] regarding the results in 2 weeks, please contact this office.    There is no fracture of this hand.  This appears to be a bad bone bruise.  Please ice the hand three times per day.  You can take ibuprofen or naproxen.  We need to practice rice at this time.  Rest, Ice, compression, and elevation.  I need you to come out of the bandage, and stretch the hand.   Hand Contusion A hand contusion is a deep bruise on your hand area. Contusions are the result of an injury that caused bleeding under the skin. The contusion may turn blue, purple, or yellow. Minor injuries will give you a painless contusion, but more severe contusions may stay painful and swollen for a few weeks. CAUSES  A contusion is usually caused by a blow, trauma, or direct force to an area of the body. SYMPTOMS   Swelling and redness of the injured area.  Discoloration of the injured area.  Tenderness and soreness of the injured area.  Pain. DIAGNOSIS  The diagnosis can be made by taking a history and performing a physical exam. An X-ray, CT scan, or MRI may be needed to determine if there were any associated injuries, such as broken bones (fractures). TREATMENT  Often, the best treatment for a hand  contusion is resting, elevating, icing, and applying cold compresses to the injured area. Over-the-counter medicines may also be recommended for pain control. HOME CARE INSTRUCTIONS   Put ice on the injured area.  Put ice in a plastic bag.  Place a towel between your skin and the bag.  Leave the ice on for 15-20 minutes, 03-04 times a day.  Only take over-the-counter or prescription medicines as directed by your caregiver. Your caregiver may recommend avoiding anti-inflammatory medicines (aspirin, ibuprofen, and naproxen) for 48 hours because these medicines may increase bruising.  If told, use an elastic wrap as directed. This can help reduce swelling. You may remove the wrap for sleeping, showering, and bathing. If your fingers become numb, cold, or blue, take the wrap off and reapply it more loosely.  Elevate your hand with pillows to reduce swelling.  Avoid overusing your hand if it is painful. SEEK IMMEDIATE MEDICAL CARE IF:   You have increased redness, swelling, or pain in your hand.  Your swelling or pain is not relieved with medicines.  You have loss of feeling in your hand or are unable to move your fingers.  Your hand turns cold or blue.  You have pain when you move your fingers.  Your hand becomes warm to the touch.  Your contusion does not improve in 2 days. MAKE SURE YOU:   Understand these instructions.  Will watch your condition.  Will get help right away if you are  not doing well or get worse.   This information is not intended to replace advice given to you by your health care provider. Make sure you discuss any questions you have with your health care provider.   Document Released: 03/23/2002 Document Revised: 06/25/2012 Document Reviewed: 03/24/2012 Elsevier Interactive Patient Education [employer]2016 Elsevier Inc.

## 2016-02-15 NOTE — Progress Notes (Signed)
[facility] [address] [place] [address] [phone]  Date:  02/14/2016   Name:  Steven Dyer   DOB:  January 09, 1996   MRN:  [id number]  PCP:  Steven Richmond, MD    History of Present Illness:  Steven Dyer is a 20 y.o. male patient who presents to [facility] for chief complaint of right hand pain. Patient states that last night around 10 PM, he slammed his hand in the door while trying to care for his dog. It slammed across his right wrist and hand. The pain was immediate. He has had swelling, and bruising in the area. It hurts to move his hand at all. He has attempted ice and Advil which has helped some. He has no numbness or tingling. He is right hand dominant.   Patient Active Problem List   Diagnosis Date Noted  . Acute bronchitis 08/24/2015  . Vasovagal syncope 03/16/2015    History reviewed. No pertinent past medical history.  Past Surgical History  Procedure Laterality Date  . Circumcision  1997    Social History  Substance Use Topics  . Smoking status: Former Games developer  . Smokeless tobacco: Never Used  . Alcohol Use: No    Family History  Problem Relation Age of Onset  . Migraines Mother   . Heart attack Mother     Died at 59 due to Cardiac arrest  . Nephrolithiasis Father   . Dementia Maternal Grandmother     Died at 68    No Known Allergies  Medication list has been reviewed and updated.  Current Outpatient Prescriptions on File Prior to Visit  Medication Sig Dispense Refill  . azithromycin (ZITHROMAX) 250 MG tablet Take 2 tabs PO x 1 dose, then 1 tab PO QD x 4 days (Patient not taking: Reported on 02/14/2016) 6 tablet 0  . benzonatate (TESSALON) 100 MG capsule Take 1-2 capsules (100-200 mg total) by mouth 3 (three) times daily as needed for cough. (Patient not taking: Reported on 02/14/2016) 40 capsule 0  . chlorpheniramine-HYDROcodone (TUSSIONEX PENNKINETIC ER) 10-8 MG/5ML SUER Take 5 mLs by mouth at bedtime as needed for cough. (Patient  not taking: Reported on 01/23/2016) 60 mL 0  . HYDROcodone-homatropine (HYCODAN) 5-1.5 MG/5ML syrup Take 5 mLs by mouth every 4 (four) hours as needed. (Patient not taking: Reported on 02/14/2016) 120 mL 0  . predniSONE (DELTASONE) 20 MG tablet Take 1 tablet (20 mg total) by mouth daily with breakfast. (Patient not taking: Reported on 02/14/2016) 12 tablet 0   No current facility-administered medications on file prior to visit.    ROS ROS otherwise unremarkable unless listed above.   Physical Examination: BP 102/68 mmHg  Pulse 91  Temp(Src) 98.5 F (36.9 C) (Oral)  Resp 16  Ht  (1.778 m)  Wt 153 lb (69.4 kg)  BMI 21.95 kg/m2  SpO2 98% Ideal Body Weight: Weight in (lb) to have BMI = 25: 173.9  Physical Exam  Constitutional: He is oriented to person, place, and time. He appears well-developed and well-nourished. No distress.  HENT:  Head: Normocephalic and atraumatic.  Eyes: Conjunctivae and EOM are normal. Pupils are equal, round, and reactive to light.  Cardiovascular: Normal rate.   Pulmonary/Chest: Effort normal. No respiratory distress.  Musculoskeletal:  There is bruising along the 3rd and 4th metacarpal.  More tender along the right metacarpal.  Strength is 4 out of 5 at the fourth and third finger upon opposition. Sensation intact. He has tenderness  along the radial area of the wrist along extensor side.  Neurological: He is alert and oriented to person, place, and time.  Skin: Skin is warm and dry. He is not diaphoretic.  Psychiatric: He has a normal mood and affect. His behavior is normal.   Dg Wrist Complete Right  02/14/2016  CLINICAL DATA:  Right wrist pain.  Injury. EXAM: RIGHT WRIST - COMPLETE 3+ VIEW COMPARISON:  03/09/2015. FINDINGS: No acute bony or joint abnormality identified. No evidence of fracture or dislocation. IMPRESSION: No acute abnormality. Electronically Signed   By: Steven Fushomas  Dyer   On: 02/14/2016 10:15   Dg Hand Complete Right  02/14/2016   CLINICAL DATA:  Pain.  Injury. EXAM: RIGHT HAND - COMPLETE 3+ VIEW COMPARISON:  03/09/2015. FINDINGS: No acute bony or joint abnormality identified. No evidence of fracture or dislocation. IMPRESSION: No acute abnormality. Electronically Signed   By: Steven Fushomas  Dyer   On: 02/14/2016 10:21     Assessment and Plan: Steven Dyer is a 20 y.o. male who is here today For chief complaint of right hand pain. The pain is to not have a fracture at this time. I have advised Rice to patient. He is asked if he needed anything for pain. I've advised him to use Tylenol or ibuprofen at this time.  He has been wrapped in an Ace bandage. Advised to come out of bed and do some range of motion exercises frequently throughout the day. He will return to clinic if his symptoms do not improve in 10 days.  Right hand pain - Plan: DG Wrist Complete Right, DG Hand Complete Right  Hand contusion, right, initial encounter  Steven PlattStephanie Elmer Boutelle, PA-C [facility] 02/15/2016 8:24 AM
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
## 2017-03-14 IMAGING — CR DG HAND COMPLETE 3+V*R*
3 series · 3 of 3 positions shown · non-contrast
Comparison: 03/09/2015.

CLINICAL DATA: Pain.  Injury.

EXAM:
RIGHT HAND - COMPLETE 3+ VIEW

[lateral]
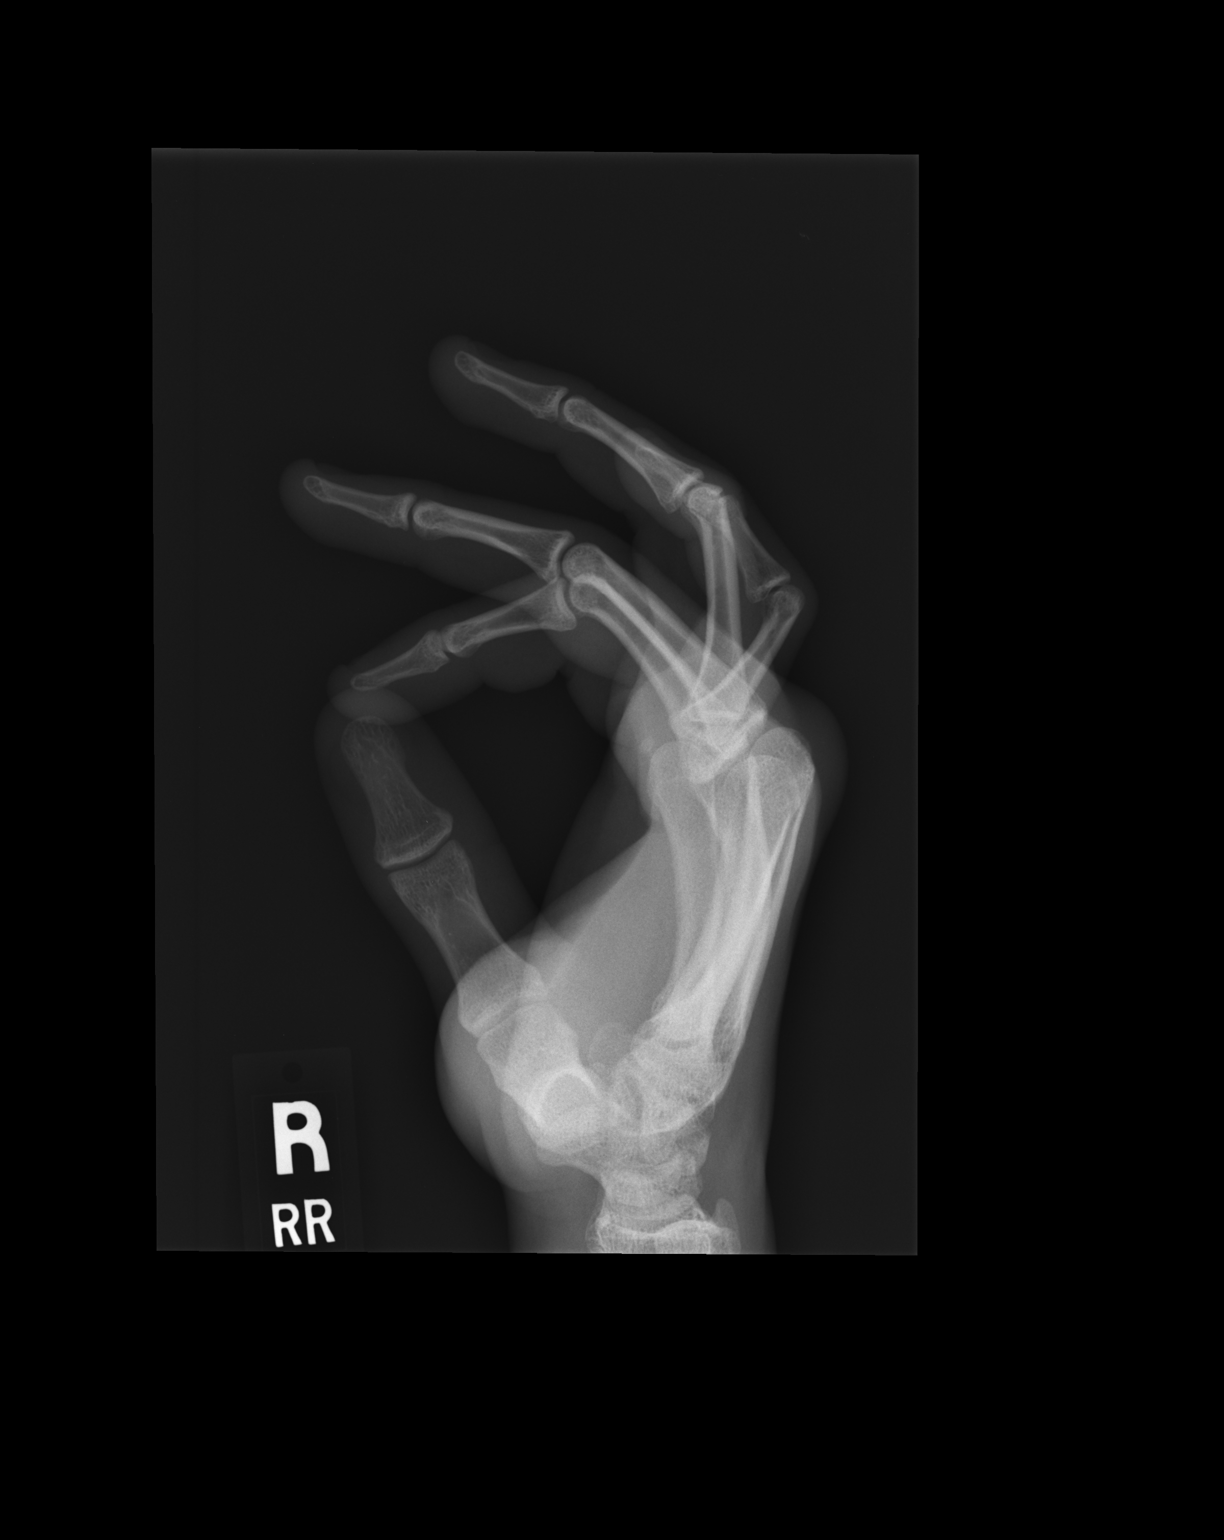

[pa obl]
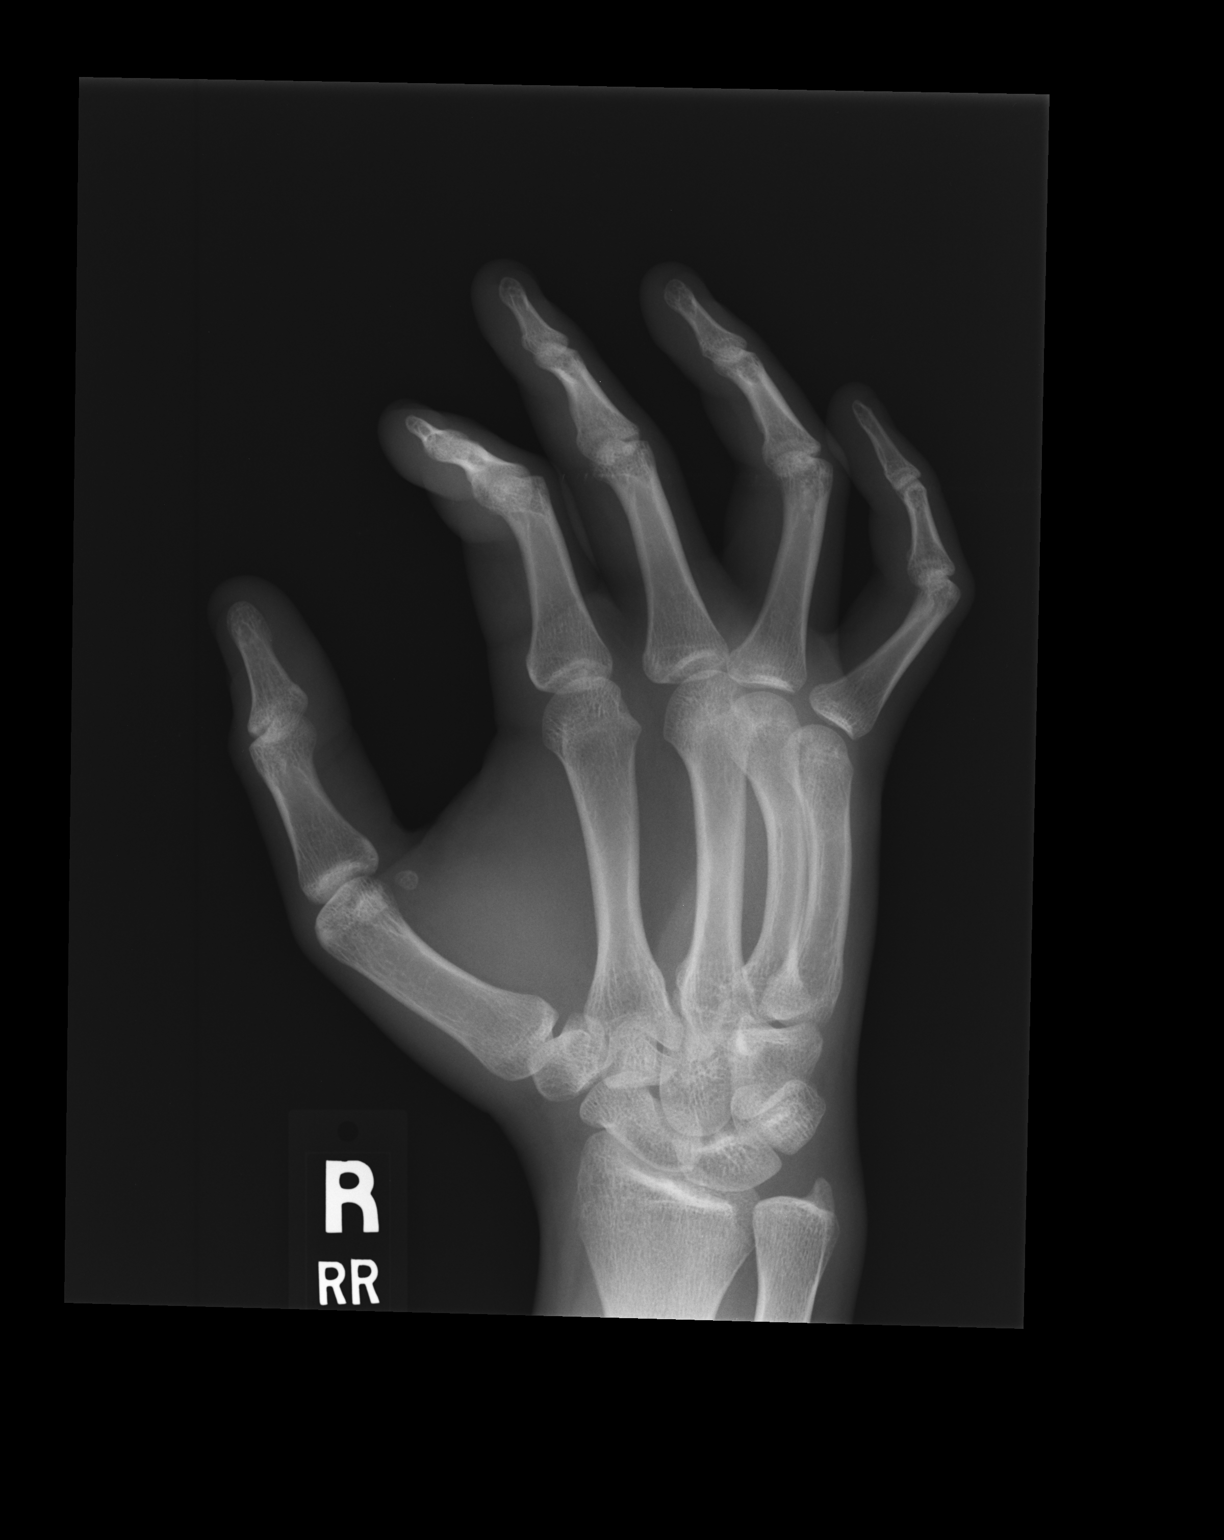

[PA]
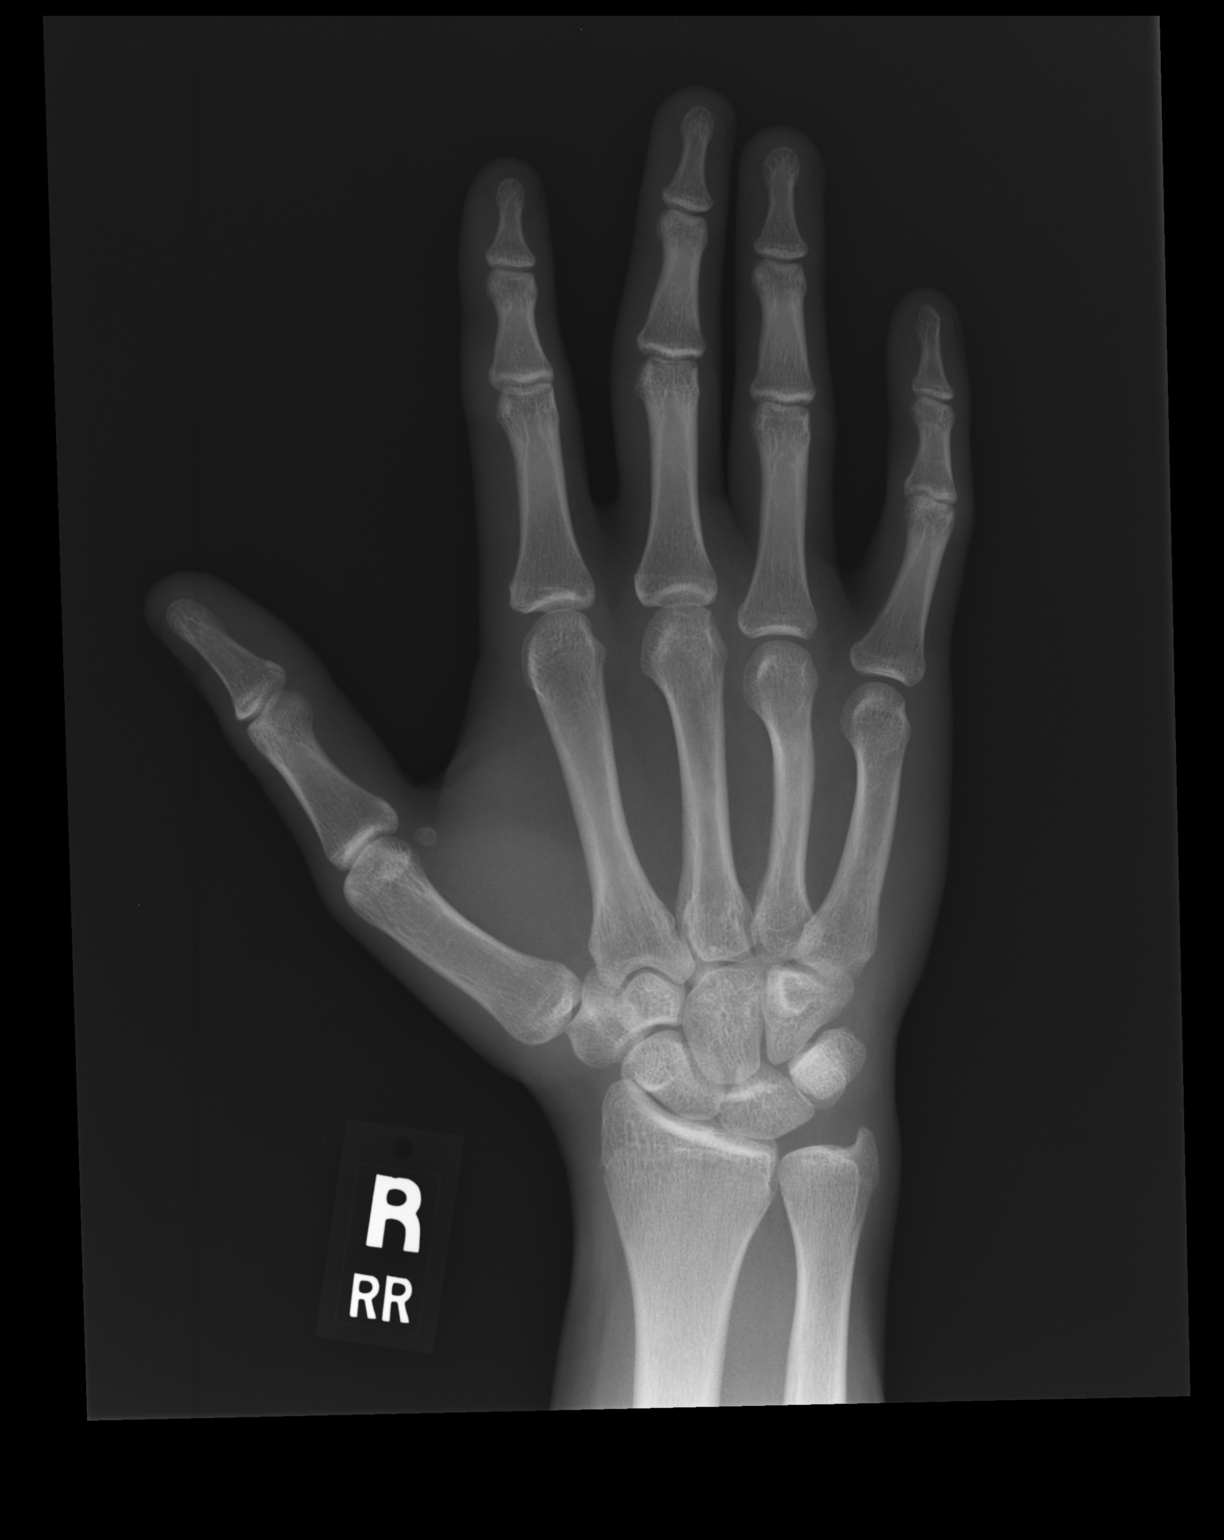

[3 of 3 positions shown; findings below may reference images not displayed]

FINDINGS: No acute bony or joint abnormality identified. No evidence of
fracture or dislocation.
IMPRESSION: No acute abnormality.

## 2018-01-10 ENCOUNTER — Other Ambulatory Visit: Payer: Self-pay

## 2018-01-10 ENCOUNTER — Encounter: Payer: Self-pay | Admitting: Physician Assistant

## 2018-01-10 ENCOUNTER — Ambulatory Visit (INDEPENDENT_AMBULATORY_CARE_PROVIDER_SITE_OTHER): Payer: Commercial Managed Care - PPO | Admitting: Physician Assistant

## 2018-01-10 VITALS — BP 108/63 | HR 78 | Temp 97.8°F | Resp 16 | Ht 69.0 in | Wt 184.0 lb

## 2018-01-10 DIAGNOSIS — S41112A Laceration without foreign body of left upper arm, initial encounter: Secondary | ICD-10-CM | POA: Diagnosis not present
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
## 2018-01-10 NOTE — Patient Instructions (Addendum)
WOUND CARE Please return in 10 days to have your stitches/staples removed or sooner if you have concerns. . Keep area clean and dry for 24 hours. Do not remove bandage, if applied. . After 24 hours, remove bandage and wash wound gently with mild soap and warm water. Reapply a new bandage after cleaning wound, if directed. . Continue daily cleansing with soap and water until stitches/staples are removed. . Do not apply any ointments or creams to the wound while stitches/staples are in place, as this may cause delayed healing. . Notify the office if you experience any of the following signs of infection: Swelling, redness, pus drainage, streaking, fever >101.0 F . Notify the office if you experience excessive bleeding that does not stop after 15-20 minutes of constant, firm pressure.      IF you received an x-ray today, you will receive an invoice from [facility]. Please contact  Radiology at [phone] with questions or concerns regarding your invoice.   IF you received labwork today, you will receive an invoice from [facility]. Please contact [facility] at [phone] with questions or concerns regarding your invoice.   Our billing staff will not be able to assist you with questions regarding bills from these companies.  You will be contacted with the lab results as soon as they are available. The fastest way to get your results is to activate your My Chart account. Instructions are located on the last page of this paperwork. If you have not heard from us regarding the results in 2 weeks, please contact this office.      

## 2018-06-02 DIAGNOSIS — M25512 Pain in left shoulder: Secondary | ICD-10-CM | POA: Diagnosis not present

## 2018-10-27 ENCOUNTER — Ambulatory Visit: Payer: Commercial Managed Care - PPO | Admitting: Family Medicine

## 2018-10-27 ENCOUNTER — Encounter: Payer: Self-pay | Admitting: Family Medicine

## 2018-10-27 ENCOUNTER — Other Ambulatory Visit: Payer: Self-pay

## 2018-10-27 VITALS — BP 118/76 | HR 84 | Temp 98.1°F | Resp 20 | Ht 69.61 in | Wt 189.0 lb

## 2018-10-27 DIAGNOSIS — F329 Major depressive disorder, single episode, unspecified: Secondary | ICD-10-CM

## 2018-10-27 DIAGNOSIS — F410 Panic disorder [episodic paroxysmal anxiety] without agoraphobia: Secondary | ICD-10-CM | POA: Diagnosis not present

## 2018-10-27 DIAGNOSIS — F411 Generalized anxiety disorder: Secondary | ICD-10-CM

## 2018-10-27 MED ORDER — LORAZEPAM 1 MG PO TABS: ORAL_TABLET | ORAL | 0 refills | Status: DC

## 2018-10-27 MED ORDER — SERTRALINE HCL 50 MG PO TABS: 50.0000 mg | ORAL_TABLET | Freq: Every day | ORAL | 3 refills | Status: DC

## 2018-10-27 NOTE — Patient Instructions (Addendum)
Try to get regular exercise.  Recommend seeing a counselor.  Some suggestions are: Steven Dyer [phone] or [phone] Steven Dyer [phone] Steven Dyer [phone]  Take sertraline (Zoloft) 50 mg 1 each morning.  It will take 2 or 3 weeks before you realize that it is making you feel better.  For the panic attacks and sleeplessness use lorazepam (Ativan) 1 mg 1/2-1 at bedtime if having panic problems.  If acutely worse at any time get re-assessed.  I urged you to seek to be physically, emotionally, relationally, and spiritually healthy. Follow-up with Dr. Leretha PolSantiago possible in 3 to 4 weeks.  If you have lab work done today you will be contacted with your lab results within the next 2 weeks.  If you have not heard from us then please contact us. The fastest way to get your results is to register for My Chart.  Panic Attack A panic attack is a sudden episode of severe anxiety, fear, or discomfort that causes physical and emotional symptoms. The attack may be in response to something frightening, or it may occur for no known reason. Symptoms of a panic attack can be similar to symptoms of a heart attack or stroke. It is important to see your health care provider when you have a panic attack so that these conditions can be ruled out. A panic attack is a symptom of another condition. Most panic attacks go away with treatment of the underlying problem. If you have panic attacks often, you may have a condition called panic disorder. What are the causes? A panic attack may be caused by:  An extreme, life-threatening situation, such as a war or natural disaster.  An anxiety disorder, such as post-traumatic stress disorder.  Depression.  Certain medical conditions, including heart problems, neurological conditions, and infections.  Certain over-the-counter and prescription medicines.  Illegal drugs that increase heart rate and blood pressure, such as  methamphetamine.  Alcohol.  Supplements that increase anxiety.  Panic disorder. What increases the risk? You are more likely to develop this condition if:  You have an anxiety disorder.  You have another mental health condition.  You take certain medicines.  You use alcohol, illegal drugs, or other substances.  You are under extreme stress.  A life event is causing increased feelings of anxiety and depression. What are the signs or symptoms? A panic attack starts suddenly, usually lasts about 20 minutes, and occurs with one or more of the following:  A pounding heart.  A feeling that your heart is beating irregularly or faster than normal (palpitations).  Sweating.  Trembling or shaking.  Shortness of breath or feeling smothered.  Feeling choked.  Chest pain or discomfort.  Nausea or a strange feeling in your stomach.  Dizziness, feeling lightheaded, or feeling like you might faint.  Chills or hot flashes.  Numbness or tingling in your lips, hands, or feet.  Feeling confused, or feeling that you are not yourself.  Fear of losing control or being emotionally unstable.  Fear of dying. How is this diagnosed? A panic attack is diagnosed with an assessment by your health care provider. During the assessment your health care provider will ask questions about:  Your history of anxiety, depression, and panic attacks.  Your medical history.  Whether you drink alcohol, use illegal drugs, take supplements, or take medicines. Be honest about your substance use. Your health care provider may also:  Order blood tests or other kinds of tests to rule out serious medical conditions.  Refer  you to a mental health professional for further evaluation. How is this treated? Treatment depends on the cause of the panic attack:  If the cause is a medical problem, your health care provider will either treat that problem or refer you to a specialist.  If the cause is  emotional, you may be given anti-anxiety medicines or referred to a counselor. These medicines may reduce how often attacks happen, reduce how severe the attacks are, and lower anxiety.  If the cause is a medicine, your health care provider may tell you to stop the medicine, change your dose, or take a different medicine.  If the cause is a drug, treatment may involve letting the drug wear off and taking medicine to help the drug leave your body or to counteract its effects. Attacks caused by drug abuse may continue even if you stop using the drug. Follow these instructions at home:  Take over-the-counter and prescription medicines only as told by your health care provider.  If you feel anxious, limit your caffeine intake.  Take good care of your physical and mental health by: ? Eating a balanced diet that includes plenty of fresh fruits and vegetables, whole grains, lean meats, and low-fat dairy. ? Getting plenty of rest. Try to get 7-8 hours of uninterrupted sleep each night. ? Exercising regularly. Try to get 30 minutes of physical activity at least 5 days a week. ? Not smoking. Talk to your health care provider if you need help quitting. ? Limiting alcohol intake to no more than 1 drink a day for nonpregnant women and 2 drinks a day for men. One drink equals 12 oz of beer, 5 oz of wine, or 1 oz of hard liquor.  Keep all follow-up visits as told by your health care provider. This is important. Panic attacks may have underlying physical or emotional problems that take time to accurately diagnose. Contact a health care provider if:  Your symptoms do not improve, or they get worse.  You are not able to take your medicine as prescribed because of side effects. Get help right away if:  You have serious thoughts about hurting yourself or others.  You have symptoms of a panic attack. Do not drive yourself to the hospital. Have someone else drive you or call an ambulance. If you ever feel  like you may hurt yourself or others, or you have thoughts about taking your own life, get help right away. You can go to your nearest emergency department or call:  Your local emergency services (911 in the [place]  A suicide crisis helpline, such as the National Suicide Prevention Lifeline at (647) 579-5365. This is open 24 hours a day. Summary  A panic attack is a sign of a serious health or mental health condition. Get help right away. Do not drive yourself to the hospital. Have someone else drive you or call an ambulance.  Always see a health care provider to have the reasons for the panic attack correctly diagnosed.  If your panic attack was caused by a physical problem, follow your health care provider's suggestions for medicine, referral to a specialist, and lifestyle changes.  If your panic attack was caused by an emotional problem, follow through with counseling from a qualified mental health specialist.  If you feel like you may hurt yourself or others, call 911 and get help right away. This information is not intended to replace advice given to you by your health care provider. Make sure you discuss any questions you  have with your health care provider. Document Released: 10/01/2005 Document Revised: 11/09/2016 Document Reviewed: 11/09/2016 Elsevier Interactive Patient Education  2019 [employer].   IF you received an x-ray today, you will receive an invoice from [facility]. Please contact [facility] at [phone] with questions or concerns regarding your invoice.   IF you received labwork today, you will receive an invoice from [facility]. Please contact [facility] at [phone] with questions or concerns regarding your invoice.   Our billing staff will not be able to assist you with questions regarding bills from these companies.  You will be contacted with the lab results as soon as they are available. The fastest way to get your results is to activate  your My Chart account. Instructions are located on the last page of this paperwork. If you have not heard from [place] regarding the results in 2 weeks, please contact this office.
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
## 2018-11-17 ENCOUNTER — Other Ambulatory Visit: Payer: Self-pay | Admitting: Family Medicine

## 2018-11-17 DIAGNOSIS — F411 Generalized anxiety disorder: Secondary | ICD-10-CM

## 2018-11-17 DIAGNOSIS — F410 Panic disorder [episodic paroxysmal anxiety] without agoraphobia: Secondary | ICD-10-CM

## 2018-11-17 NOTE — Telephone Encounter (Signed)
Copied from CRM [id number]. Topic: Quick Communication - Rx Refill/Question >> Nov 17, 2018  3:31 PM Jens Som A wrote: Medication: LORazepam (ATIVAN) 1 MG tablet [[id number]] -if need to schedule an appt call dad Geff Schechter-[phone]  Has the patient contacted their pharmacy? Yes  (Agent: If no, request that the patient contact the pharmacy for the refill.) (Agent: If yes, when and what did the pharmacy advise?)  Preferred Pharmacy (with phone number or street name): [id number] [place] Otto, [place] - [address] AT [facility] [address] [phone] (Phone) [phone] (Fax)    Agent: Please be advised that RX refills may take up to 3 business days. We ask that you follow-up with your pharmacy.

## 2018-11-19 NOTE — Telephone Encounter (Signed)
Pt's father called to follow up on status of refill. Advised that they can take up to 3 business days. He stated his some was out of medication and having anxiety. Please advise once refill has been sent. CB#[phone]

## 2018-11-20 NOTE — Telephone Encounter (Signed)
[facility]   center called and pt needs to hear back regarding this request previously sent.

## 2018-11-21 ENCOUNTER — Telehealth: Payer: Self-pay | Admitting: Pediatrics

## 2018-11-21 MED ORDER — LORAZEPAM 1 MG PO TABS: ORAL_TABLET | ORAL | 0 refills | Status: DC

## 2018-11-21 NOTE — Telephone Encounter (Signed)
I have called pt and informed him that we have seen the message and since the provider has not seen the pt she is going to have to look into the chart. She has a full clinic today but we are working on it. He stated understanding.   Expressed importance of keeping the appointment tomorrow.  Thanks, [employer]

## 2018-11-21 NOTE — Telephone Encounter (Signed)
Copied from CRM [id number]. Topic: Quick Communication - See Telephone Encounter >> Nov 21, 2018  8:35 AM Retta Diones wrote: CRM for notification. See Telephone encounter for: 11/21/18. Pt's father called to follow up on status of refill. He wants to come by the office to pick up prescription. Made an appointment for tomorrow. Patient advised this was the third time calling

## 2018-11-21 NOTE — Telephone Encounter (Signed)
Reviewed Dr Caryl NeverHoppers note, las OV jan 2020 pmp reviewed Med refilled Has appt with me tomorrow

## 2018-11-21 NOTE — Telephone Encounter (Signed)
Pt dad has come to the office stating that the pt needs his Ativan 1mg  for Rx for his anxiety. Pt has tried to get a refill for a few days.   Pt has an appointment with provider for tomorrow. Please advise on refill.   Thanks, [employer]

## 2018-11-21 NOTE — Telephone Encounter (Signed)
Request for refill was sent 11/17/18- no response from office- please review for patient.

## 2018-11-22 ENCOUNTER — Other Ambulatory Visit: Payer: Self-pay

## 2018-11-22 ENCOUNTER — Encounter: Payer: Self-pay | Admitting: Family Medicine

## 2018-11-22 ENCOUNTER — Ambulatory Visit: Payer: Commercial Managed Care - PPO | Admitting: Family Medicine

## 2018-11-22 DIAGNOSIS — F329 Major depressive disorder, single episode, unspecified: Secondary | ICD-10-CM | POA: Diagnosis not present

## 2018-11-22 DIAGNOSIS — F410 Panic disorder [episodic paroxysmal anxiety] without agoraphobia: Secondary | ICD-10-CM | POA: Diagnosis not present

## 2018-11-22 DIAGNOSIS — F411 Generalized anxiety disorder: Secondary | ICD-10-CM | POA: Diagnosis not present

## 2018-11-22 MED ORDER — SERTRALINE HCL 100 MG PO TABS: 100.0000 mg | ORAL_TABLET | Freq: Every day | ORAL | 3 refills | Status: DC

## 2018-11-22 NOTE — Patient Instructions (Signed)
° ° ° °  If you have lab work done today you will be contacted with your lab results within the next 2 weeks.  If you have not heard from us then please contact us. The fastest way to get your results is to register for My Chart. ° ° °IF you received an x-ray today, you will receive an invoice from [facility]. Please contact [facility] at [phone] with questions or concerns regarding your invoice.  ° °IF you received labwork today, you will receive an invoice from [facility]. Please contact [facility] at [phone] with questions or concerns regarding your invoice.  ° °Our billing staff will not be able to assist you with questions regarding bills from these companies. ° °You will be contacted with the lab results as soon as they are available. The fastest way to get your results is to activate your My Chart account. Instructions are located on the last page of this paperwork. If you have not heard from us regarding the results in 2 weeks, please contact this office. °  ° ° ° °

## 2018-11-22 NOTE — Progress Notes (Signed)
2/8/202010:40 AM  Steven Dyer 11-05-1995, 23 y.o. male [id number]  Chief Complaint  Patient presents with  . Anxiety    need refills on  ativan and zoloft. Dad wants to discuss about increasing one of the medication    HPI:   Patient is a 23 y.o. male with past medical history significant for anxiety with panic attacks, reactive depression who presents today for followup  Saw Dr Alwyn Ren on Oct 27 2018 Started on sertraline and ativan Referred to counseling Unclear if legal consequences  Sleeping well with ativan Sertraline working well, able to focus more, mood improved, having less panic attacks Wakes up in panic and sometimes intrusive thoughts, no nightmares, no intrusive thoughts during the day Will be calling tomorrow therapist Denies SI Has good support system  Fall Risk  11/22/2018 10/27/2018 01/10/2018 02/14/2016  Falls in the past year? 0 1 No No  Number falls in past yr: 0 - - -  Injury with Fall? 0 - - -     Depression screen [facility] 2/9 11/22/2018 10/27/2018 01/10/2018  Decreased Interest 0 2 0  Down, Depressed, Hopeless 0 2 0  PHQ - 2 Score 0 4 0  Altered sleeping 2 3 -  Tired, decreased energy 2 1 -  Change in appetite 0 0 -  Feeling bad or failure about yourself  0 1 -  Trouble concentrating 0 1 -  Moving slowly or fidgety/restless 0 0 -  Suicidal thoughts 0 0 -  PHQ-9 Score 4 10 -  Difficult doing work/chores Not difficult at all Somewhat difficult -    No Known Allergies  Prior to Admission medications   Medication Sig Start Date End Date Taking? Authorizing Provider  LORazepam (ATIVAN) 1 MG tablet Take 1/2 to 1 pill at bedtime as needed for panic and sleep 11/21/18  Yes Myles Lipps, MD  sertraline (ZOLOFT) 50 MG tablet Take 1 tablet (50 mg total) by mouth daily. 10/27/18  Yes Peyton Najjar, MD    No past medical history on file.  Past Surgical History:  Procedure Laterality Date  . CIRCUMCISION  1997    Social History   Tobacco Use  .  Smoking status: Former Smoker    Types: Cigarettes    Last attempt to quit: 04/01/2018    Years since quitting: 0.6  . Smokeless tobacco: Never Used  Substance Use Topics  . Alcohol use: No    Alcohol/week: 0.0 standard drinks    Family History  Problem Relation Age of Onset  . Migraines Mother   . Heart attack Mother        Died at 90 due to Cardiac arrest  . Nephrolithiasis Father   . Dementia Maternal Grandmother        Died at 75    ROS Per hpi  OBJECTIVE:  Blood pressure 138/80, pulse (!) 102, temperature 98.6 F (37 C), temperature source Oral, resp. rate 16, height 5\' 10"  (1.778 m), weight 191 lb (86.6 kg), SpO2 97 %. Body mass index is 27.41 kg/m.   Physical Exam Vitals signs and nursing note reviewed.  Constitutional:      Appearance: He is well-developed.  HENT:     Head: Normocephalic and atraumatic.  Eyes:     Conjunctiva/sclera: Conjunctivae normal.     Pupils: Pupils are equal, round, and reactive to light.  Neck:     Musculoskeletal: Neck supple.  Pulmonary:     Effort: Pulmonary effort is normal.  Skin:  General: Skin is warm and dry.  Neurological:     Mental Status: He is alert and oriented to person, place, and time.  Psychiatric:        Mood and Affect: Mood is anxious.      ASSESSMENT and PLAN  1. Panic anxiety syndrome 2. Generalized anxiety disorder 3. Reactive depression Improving, increasing sertraline. I refilled ativan yesterday. Stressed importance of counseling. - sertraline (ZOLOFT) 100 MG tablet; Take 1 tablet (100mg  total) by mouth daily.    Return in about 3 weeks (around 12/13/2018).    Myles Lipps, MD Primary Care at [facility] [address] [place], [place] [address] Ph.  [phone] Fax [phone]

## 2018-12-18 ENCOUNTER — Other Ambulatory Visit: Payer: Self-pay

## 2018-12-18 ENCOUNTER — Encounter: Payer: Self-pay | Admitting: Family Medicine

## 2018-12-18 ENCOUNTER — Ambulatory Visit (INDEPENDENT_AMBULATORY_CARE_PROVIDER_SITE_OTHER): Payer: Commercial Managed Care - PPO | Admitting: Family Medicine

## 2018-12-18 VITALS — BP 139/90 | HR 89 | Temp 98.1°F | Ht 71.0 in | Wt 189.2 lb

## 2018-12-18 DIAGNOSIS — F4310 Post-traumatic stress disorder, unspecified: Secondary | ICD-10-CM | POA: Diagnosis not present

## 2018-12-18 MED ORDER — ESCITALOPRAM OXALATE 10 MG PO TABS: 10.0000 mg | ORAL_TABLET | Freq: Every day | ORAL | 1 refills | Status: DC

## 2018-12-18 MED ORDER — PRAZOSIN HCL 1 MG PO CAPS: 1.0000 mg | ORAL_CAPSULE | Freq: Every day | ORAL | 0 refills | Status: DC

## 2018-12-18 MED ORDER — LORAZEPAM 1 MG PO TABS: ORAL_TABLET | ORAL | 0 refills | Status: DC

## 2018-12-18 NOTE — Patient Instructions (Signed)
° ° ° °  If you have lab work done today you will be contacted with your lab results within the next 2 weeks.  If you have not heard from us then please contact us. The fastest way to get your results is to register for My Chart. ° ° °IF you received an x-ray today, you will receive an invoice from [facility]. Please contact [facility] at [phone] with questions or concerns regarding your invoice.  ° °IF you received labwork today, you will receive an invoice from [facility]. Please contact [facility] at [phone] with questions or concerns regarding your invoice.  ° °Our billing staff will not be able to assist you with questions regarding bills from these companies. ° °You will be contacted with the lab results as soon as they are available. The fastest way to get your results is to activate your My Chart account. Instructions are located on the last page of this paperwork. If you have not heard from us regarding the results in 2 weeks, please contact this office. °  ° ° ° °
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
## 2019-01-02 ENCOUNTER — Ambulatory Visit: Payer: Commercial Managed Care - PPO | Admitting: Family Medicine

## 2019-01-15 ENCOUNTER — Telehealth: Payer: Commercial Managed Care - PPO | Admitting: Family Medicine

## 2019-06-29 ENCOUNTER — Other Ambulatory Visit: Payer: Self-pay

## 2019-06-29 ENCOUNTER — Telehealth (INDEPENDENT_AMBULATORY_CARE_PROVIDER_SITE_OTHER): Payer: Commercial Managed Care - PPO | Admitting: Family Medicine

## 2019-06-29 DIAGNOSIS — F4310 Post-traumatic stress disorder, unspecified: Secondary | ICD-10-CM | POA: Diagnosis not present

## 2019-06-29 MED ORDER — LORAZEPAM 1 MG PO TABS: ORAL_TABLET | ORAL | 0 refills | Status: DC

## 2019-06-29 NOTE — Progress Notes (Signed)
Spoke with pt and his father Steven Dyer whom is on the pt release form, Steven Dyer is having trouble sleeping. Says he has been having panic attacks while trying to sleep. He asking to be put bk on the ativan. He says the lexapro make him jittery and not able to sleep. His father says Steven Dyer has some court visits scheduled and he really needs something to help him relax, at lease for the next few days. He also says he will be the one keeping the medication to give to him for his scheduled doses
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
## 2024-08-20 ENCOUNTER — Encounter (HOSPITAL_COMMUNITY): Payer: Self-pay

## 2024-08-20 ENCOUNTER — Ambulatory Visit (HOSPITAL_COMMUNITY)
Admission: EM | Admit: 2024-08-20 | Discharge: 2024-08-20 | Disposition: A | Discharge status: Home / Self Care | Attending: Nurse Practitioner | Admitting: Nurse Practitioner

## 2024-08-20 DIAGNOSIS — F419 Anxiety disorder, unspecified: Secondary | ICD-10-CM

## 2024-08-20 DIAGNOSIS — L84 Corns and callosities: Secondary | ICD-10-CM | POA: Diagnosis not present

## 2024-08-20 MED ORDER — MUPIROCIN 2 % EX OINT: 1.0000 | TOPICAL_OINTMENT | Freq: Two times a day (BID) | CUTANEOUS | 0 refills | Status: AC

## 2024-08-20 MED ORDER — HYDROXYZINE HCL 25 MG PO TABS: 25.0000 mg | ORAL_TABLET | Freq: Every evening | ORAL | 0 refills | Status: AC | PRN

## 2024-08-20 NOTE — ED Provider Notes (Signed)
 [facility]    CSN: [id number] Arrival date & time: 08/20/24  1247      History   Chief Complaint Chief Complaint  Patient presents with   Anxiety   Wound Check    HPI Steven Dyer is a 28 y.o. male.   1.  Patient presents for assistance with managing situational anxiety-his father has been hospitalized for the past 2-1/2 weeks.  Underwent open heart surgery several days ago.  Overall is improving but has been told to expect that could change at any time.  He reports feeling anxious consistently and is having difficulty sleeping at night.  He is able to eat and drink without difficulty.  No medications have been taken for the symptoms as of yet.  Denies any suicidal or homicidal ideations.  Has not been under the care of a provider for anxiety/depression for several years now.  Patient states he is still able to work.  Patient reports his mother died at a young age and he is an only child.  2.  He is also requesting assistance for cracking skin on his toes and right heel.  States has been ongoing for the past several months and has worsened over the past several weeks due to increased walking around in the hospital with his father.  Has been soaking his feet in Epsom salt water without any improvement.  States the crocs are becoming increasingly painful.  The history is provided by the patient.  Anxiety Pertinent negatives include no chest pain, no abdominal pain, no headaches and no shortness of breath.  Wound Check Pertinent negatives include no chest pain, no abdominal pain, no headaches and no shortness of breath.    History reviewed. No pertinent past medical history.  Patient Active Problem List   Diagnosis Date Noted   PTSD (post-traumatic stress disorder) 12/18/2018   Vasovagal syncope 03/16/2015    Past Surgical History:  Procedure Laterality Date   CIRCUMCISION  1997       Home Medications    Prior to Admission medications   Medication Sig Start  Date End Date Taking? Authorizing Provider  hydrOXYzine (ATARAX) 25 MG tablet Take 1 tablet (25 mg total) by mouth at bedtime as needed. 08/20/24  Yes Janet Therisa PARAS, FNP  mupirocin ointment (BACTROBAN) 2 % Apply 1 Application topically 2 (two) times daily for 7 days. 08/20/24 08/27/24 Yes Janet Therisa PARAS, FNP    Family History Family History  Problem Relation Age of Onset   Migraines Mother    Heart attack Mother        Died at 53 due to Cardiac arrest   Nephrolithiasis Father    Dementia Maternal Grandmother        Died at 60    Social History Social History   Tobacco Use   Smoking status: Former    Current packs/day: 0.00    Types: Cigarettes    Quit date: 04/01/2018    Years since quitting: 6.3   Smokeless tobacco: Never  Vaping Use   Vaping status: Never Used  Substance Use Topics   Alcohol use: No    Alcohol/week: 0.0 standard drinks of alcohol   Drug use: No     Allergies   Patient has no known allergies.   Review of Systems Review of Systems  Constitutional:  Negative for chills, fatigue and fever.  HENT:  Negative for rhinorrhea and sore throat.   Eyes:  Negative for pain and redness.  Respiratory:  Negative for cough  and shortness of breath.   Cardiovascular:  Negative for chest pain.  Gastrointestinal:  Negative for abdominal pain, diarrhea and vomiting.  Genitourinary:  Negative for dysuria and urgency.  Musculoskeletal:  Negative for back pain and myalgias.  Skin:  Positive for wound.       Cracking skin on both his great toes and right heel  Neurological:  Negative for dizziness and headaches.  Psychiatric/Behavioral:  Positive for sleep disturbance. Negative for suicidal ideas. The patient is nervous/anxious.      Physical Exam Triage Vital Signs ED Triage Vitals  Encounter Vitals Group     BP 08/20/24 1428 (!) 138/59     Girls Systolic BP Percentile --      Girls Diastolic BP Percentile --      Boys Systolic BP Percentile --      Boys  Diastolic BP Percentile --      Pulse Rate 08/20/24 1428 (!) 112     Resp 08/20/24 1428 18     Temp 08/20/24 1428 98 F (36.7 C)     Temp Source 08/20/24 1428 Oral     SpO2 08/20/24 1428 98 %     Weight --      Height --      Head Circumference --      Peak Flow --      Pain Score 08/20/24 1427 0     Pain Loc --      Pain Education --      Exclude from Growth Chart --    No data found.  Updated Vital Signs BP (!) 138/59 (BP Location: Left Arm)   Pulse (!) 112   Temp 98 F (36.7 C) (Oral)   Resp 18   SpO2 98%   Visual Acuity Right Eye Distance:   Left Eye Distance:   Bilateral Distance:    Right Eye Near:   Left Eye Near:    Bilateral Near:     Physical Exam Vitals and nursing note reviewed.  Constitutional:      Appearance: Normal appearance.  HENT:     Head: Normocephalic.  Cardiovascular:     Rate and Rhythm: Tachycardia present.     Heart sounds: No murmur heard.    Comments: Likely due to anxiety Pulmonary:     Effort: Pulmonary effort is normal.     Breath sounds: Normal breath sounds.  Abdominal:     General: Bowel sounds are normal.  Skin:    General: Skin is warm and dry.     Comments: Patient has calluses to the bilateral medial/plantar aspects of his great toes-the skin is fissuring and cracking.  It appears that there may be dirt engrained into the fissures.  Similar findings noted to the plantar surface of the right heel.  Neurological:     General: No focal deficit present.     Mental Status: He is alert and oriented to person, place, and time.  Psychiatric:        Mood and Affect: Mood normal.        Behavior: Behavior normal.        Thought Content: Thought content normal.        Judgment: Judgment normal.    UC Treatments / Results  Labs (all labs ordered are listed, but only abnormal results are displayed) Labs Reviewed - No data to display  EKG   Radiology No results found.  Procedures Procedures (including critical care  time)  Medications Ordered in UC Medications - No  data to display  Initial Impression / Assessment and Plan / UC Course  I have reviewed the triage vital signs and the nursing notes.  Pertinent labs & imaging results that were available during my care of the patient were reviewed by me and considered in my medical decision making (see chart for details).    Patient presents for assistance with managing situational anxiety while his father is hospitalized after open heart surgery.  The primary symptoms are feeling anxious and not sleeping well at night.  He is not suicidal or homicidal.  He is not currently taking any medications.  Reasonable to provide a short course of hydroxyzine to bridge him to his primary care provider for ongoing management.  While future psychiatric symptoms are impossible to predict, he does appear stable, easily communicative, and understands his plan of care and follow up. He has deep fissuring calluses to his bilateral great toes and right heel.  I discussed skin moisturization and continued Epsom salt soaks.  Due to the appearances of the fissures with possible embedded dirt, provided a prescription for mupirocin ointment.  Recommended use of Aquaphor and Karasal until he can be evaluated by podiatry.  He is aware to coordinate that referral through his PCP. Final Clinical Impressions(s) / UC Diagnoses   Final diagnoses:  Anxiety  Foot callus (bilateral)     Discharge Instructions      Will prescribe you a low dose medication for anxiety/help you sleep at night.  Please follow up with your primary care provider for additional evaluation and management of these symptoms while your father is hospitalized.   I have prescribed an antibiotic ointment for you to use on your feet.  Please obtain a moisturizer such as Kerasal or Aquaphor from the store and apply to your feet as the label directs.  It's helpful to moisturize your feet before bed and wear socks to help it  soak in.  May also soak your feet in [facility].  Would recommend seeing a podiatrist due to the extend of the fissures/cracks in your calluses.     ED Prescriptions     Medication Sig Dispense Auth. Provider   hydrOXYzine (ATARAX) 25 MG tablet Take 1 tablet (25 mg total) by mouth at bedtime as needed. 12 tablet Janet Therisa PARAS, FNP   mupirocin ointment (BACTROBAN) 2 % Apply 1 Application topically 2 (two) times daily for 7 days. 30 g Janet Therisa PARAS, FNP      PDMP not reviewed this encounter.   Janet Therisa PARAS, FNP 08/20/24 [id number]

## 2024-08-20 NOTE — ED Triage Notes (Signed)
 Pt states his dad is in the hospital not doing good and his anxiety is very bad and can't sleep for past few days.  Pt c/o cuts to bottom of his feet for past few months.

## 2024-08-20 NOTE — Discharge Instructions (Addendum)
 Will prescribe you a low dose medication for anxiety/help you sleep at night.  Please follow up with your primary care provider for additional evaluation and management of these symptoms while your father is hospitalized.   I have prescribed an antibiotic ointment for you to use on your feet.  Please obtain a moisturizer such as Kerasal or Aquaphor from the store and apply to your feet as the label directs.  It's helpful to moisturize your feet before bed and wear socks to help it soak in.  May also soak your feet in [facility].  Would recommend seeing a podiatrist due to the extend of the fissures/cracks in your calluses.

## 2024-08-27 ENCOUNTER — Emergency Department (HOSPITAL_COMMUNITY)
Admission: EM | Admit: 2024-08-27 | Discharge: 2024-08-27 | Discharge status: Against Medical Advice | Attending: Emergency Medicine | Admitting: Emergency Medicine

## 2024-08-27 ENCOUNTER — Other Ambulatory Visit: Payer: Self-pay

## 2024-08-27 DIAGNOSIS — Z5321 Procedure and treatment not carried out due to patient leaving prior to being seen by health care provider: Secondary | ICD-10-CM | POA: Insufficient documentation

## 2024-08-27 DIAGNOSIS — F419 Anxiety disorder, unspecified: Secondary | ICD-10-CM | POA: Insufficient documentation

## 2024-08-27 NOTE — ED Notes (Signed)
Pt informed staff he was leaving.

## 2024-08-27 NOTE — ED Triage Notes (Signed)
 Patient requesting anxiety medication , states father is hospitalized . Denies SI or HI , no hallucinations .

## 2024-08-30 ENCOUNTER — Other Ambulatory Visit: Payer: Self-pay

## 2024-08-30 ENCOUNTER — Emergency Department (HOSPITAL_COMMUNITY)
Admission: EM | Admit: 2024-08-30 | Discharge: 2024-08-30 | Disposition: A | Discharge status: Home / Self Care | Attending: Emergency Medicine | Admitting: Emergency Medicine

## 2024-08-30 ENCOUNTER — Encounter (HOSPITAL_COMMUNITY): Payer: Self-pay | Admitting: Emergency Medicine

## 2024-08-30 DIAGNOSIS — F4322 Adjustment disorder with anxiety: Secondary | ICD-10-CM | POA: Insufficient documentation

## 2024-08-30 DIAGNOSIS — F410 Panic disorder [episodic paroxysmal anxiety] without agoraphobia: Secondary | ICD-10-CM | POA: Diagnosis present

## 2024-08-30 MED ORDER — LORAZEPAM 0.5 MG PO TABS: 0.5000 mg | ORAL_TABLET | Freq: Every evening | ORAL | 0 refills | Status: AC | PRN

## 2024-08-30 MED ORDER — LORAZEPAM 1 MG PO TABS
0.5000 mg | ORAL_TABLET | Freq: Once | ORAL | Status: AC
  Administered 2024-08-30: 0.5 mg via ORAL
  Filled 2024-08-30: qty 1

## 2024-08-30 NOTE — Discharge Instructions (Signed)
 Follow up with [facility] for psychiatric assessment. You may also reference the resource guide provided for psychiatric services in the area. Return for new or concerning symptoms.

## 2024-08-30 NOTE — ED Provider Notes (Signed)
 [facility] AT [facility] Provider Note   CSN: [id number] Arrival date & time: 08/30/24  9884     Patient presents with: Panic Attack   Steven Dyer is a 28 y.o. male.   28 y/o male with hx of anxiety presents to the ED for worsening panic attacks. States his father is admitted in the ICU and he is not sure he is going to make it. Initially denies hx of anxiety and depression; however, chart review suggest otherwise. Last psychiatric f/u in 2020. Not currently on any maintenance medications. Tried Atarax prescribed by Urgent Care, but this did not help him. Denies SI/HI.     The history is provided by the patient. No language interpreter was used.       Prior to Admission medications   Medication Sig Start Date End Date Taking? Authorizing Provider  LORazepam  (ATIVAN ) 0.5 MG tablet Take 1 tablet (0.5 mg total) by mouth at bedtime as needed for anxiety. 08/30/24  Yes [name], PA-C  hydrOXYzine (ATARAX) 25 MG tablet Take 1 tablet (25 mg total) by mouth at bedtime as needed. 08/20/24   Janet Therisa PARAS, FNP    Allergies: Patient has no known allergies.    Review of Systems Ten systems reviewed and are negative for acute change, except as noted in the HPI.    Updated Vital Signs BP 131/79 (BP Location: Left Arm)   Pulse 96   Temp 98.6 F (37 C)   Resp 16   Ht 5' 11 (1.803 m)   Wt 85 kg   SpO2 95%   BMI 26.14 kg/m   Physical Exam Vitals and nursing note reviewed.  Constitutional:      General: He is not in acute distress.    Appearance: He is well-developed. He is not diaphoretic.     Comments: Nontoxic appearing and in NAD  HENT:     Head: Normocephalic and atraumatic.  Eyes:     General: No scleral icterus.    Conjunctiva/sclera: Conjunctivae normal.  Pulmonary:     Effort: Pulmonary effort is normal. No respiratory distress.  Musculoskeletal:        General: Normal range of motion.     Cervical back: Normal range of  motion.  Skin:    General: Skin is warm and dry.     Coloration: Skin is not pale.     Findings: No erythema or rash.  Neurological:     Mental Status: He is alert and oriented to person, place, and time.  Psychiatric:        Mood and Affect: Mood is anxious and depressed. Affect is tearful.        Speech: Speech normal.        Behavior: Behavior normal.        Thought Content: Thought content does not include homicidal or suicidal ideation.     Comments: Good eye contact. Denies SI/HI.     (all labs ordered are listed, but only abnormal results are displayed) Labs Reviewed - No data to display  EKG: None  Radiology: No results found.   Procedures   Medications Ordered in the ED  LORazepam  (ATIVAN ) tablet 0.5 mg (0.5 mg Oral Given 08/30/24 0146)    Clinical Course as of 08/30/24 0306  Sun Aug 30, 2024  0232 Patient feeling better after medications.  Encouraged presentation to [facility] in AM for psychiatric assessment. [KH]    Clinical Course User Index [KH] [name], PA-C  Medical Decision Making Risk Prescription drug management.   This patient presents to the ED for concern of anxiety/panic attacks, this involves an extensive number of treatment options, and is a complaint that carries with it a high risk of complications and morbidity.  The differential diagnosis includes GAD vs adjustment disorder vs substance induced mood d/o   Co morbidities that complicate the patient evaluation  PTSD   Additional history obtained:  Additional history obtained from EMS personnel External records from outside source obtained and reviewed including PCP visit from March 2020   Cardiac Monitoring:  The patient was maintained on a cardiac monitor.  I personally viewed and interpreted the cardiac monitored which showed an underlying rhythm of: NSR   Medicines ordered and prescription drug management:  I ordered medication including  Ativan  for anxiety  Reevaluation of the patient after these medicines showed that the patient improved I have reviewed the patients home medicines and have made adjustments as needed   Test Considered:  UDS   Problem List / ED Course:  Worsening anxiety which appears to be triggered by recent hospitalization of father.  Patient reports racing thoughts and trouble sleeping.  His symptoms have improved after receiving 0.5 mg Ativan  in the ED.  On chart review, patient was previously on Ativan  at bedtime for panic/sleep.  This was last prescribed him in 2020. No recent prescriptions on PDMP review.  Have provided outpatient resources as well as instruction to present to [facility] during business hours for walk-in assessment on the second floor. Patient denies any suicidal or homicidal thoughts.  He makes good eye contact with clear thought processes.  Do not feel he is a harm to himself or others.  Will proceed with discharge and outpatient follow-up.   Reevaluation:  After the interventions noted above, I reevaluated the patient and found that they have :improved   Social Determinants of Health:  Lack of social support - mother deceased; father currently in ICU. Denies access to other family or friends.   Dispostion:  After consideration of the diagnostic results and the patients response to treatment, I feel that the patent would benefit from outpatient behavioral assessment. Return precautions discussed and provided. Patient discharged in stable condition with no unaddressed concerns.       Final diagnoses:  Adjustment disorder with anxiety    ED Discharge Orders          Ordered    LORazepam  (ATIVAN ) 0.5 MG tablet  At bedtime PRN        08/30/24 0235               [name], PA-C 08/30/24 9687    Jerral Meth, MD 08/30/24 2306

## 2024-08-30 NOTE — ED Provider Triage Note (Signed)
 Emergency Medicine Provider Triage Evaluation Note  Steven Dyer , a 28 y.o. male  was evaluated in triage.  Pt complains of worsening panic attacks. States his father is admitted and he is not sure he is going to make it. Denies hx of anxiety or depression. Not on any psychiatric medication. Tried Atarax prescribed by Urgent Care, but this did not help him. Denies SI/HI.  Review of Systems  Positive: As above Negative: As above  Physical Exam  BP 131/79 (BP Location: Left Arm)   Pulse 96   Temp 98.6 F (37 C)   Resp 16   Ht 5' 11 (1.803 m)   Wt 85 kg   SpO2 95%   BMI 26.14 kg/m  Gen:   Awake, no distress   Resp:  Normal effort  MSK:   Moves extremities without difficulty  Other:  Depressed mood  Medical Decision Making  Medically screening exam initiated at 1:31 AM.  Appropriate orders placed.  Steven Dyer was informed that the remainder of the evaluation will be completed by another provider, this initial triage assessment does not replace that evaluation, and the importance of remaining in the ED until their evaluation is complete.  Adjustment disorder w/depressed mood - does not appear a harm to self or others. Medications ordered.   [name], PA-C 08/30/24 9863

## 2024-08-30 NOTE — ED Triage Notes (Signed)
 Patient from home BIB [facility] for troubles with increasing panic attacks over the last 3 days. Reports new stressor of his father being hospitalized in the ICU in critical condition. During triage very tearful. Denies SI or HI currently. Stating he just wants help with dealing with his anxiety and depression currently. Not currently medicated for same. Denies Etoh of drug use tonight.
